# Patient Record
Sex: Male | Born: 1960
Health system: Southern US, Community
[De-identification: ages and names within clinical notes are randomized; demographics above are authoritative.]

## PROBLEM LIST (undated history)

## (undated) DIAGNOSIS — K644 Residual hemorrhoidal skin tags: Secondary | ICD-10-CM

## (undated) DIAGNOSIS — K5900 Constipation, unspecified: Secondary | ICD-10-CM

## (undated) HISTORY — DX: Residual hemorrhoidal skin tags: K64.4

## (undated) HISTORY — DX: Constipation, unspecified: K59.00

---

## 2004-02-16 ENCOUNTER — Ambulatory Visit: Payer: Self-pay | Admitting: Unknown Physician Specialty

## 2008-01-22 ENCOUNTER — Emergency Department: Payer: Self-pay | Admitting: Emergency Medicine

## 2010-04-28 HISTORY — PX: WRIST GANGLION EXCISION: SUR520

## 2014-04-28 HISTORY — PX: COLONOSCOPY: SHX174

## 2014-11-02 ENCOUNTER — Telehealth: Payer: Self-pay | Admitting: Family Medicine

## 2014-11-02 NOTE — Telephone Encounter (Signed)
Mailed results and gave info.

## 2014-11-02 NOTE — Telephone Encounter (Signed)
Pt said he was recently here for a physical and was given results of his test but he lost them.  He asked if they could be mailed to him.  He also said he was told about a clinic where he could be tested for hepatitis and hiv.  He asked if you could give him that telephone number so he could make an appt.  Please call

## 2014-12-27 ENCOUNTER — Ambulatory Visit (INDEPENDENT_AMBULATORY_CARE_PROVIDER_SITE_OTHER): Payer: BLUE CROSS/BLUE SHIELD | Admitting: Family Medicine

## 2014-12-27 ENCOUNTER — Encounter: Payer: Self-pay | Admitting: Family Medicine

## 2014-12-27 VITALS — BP 126/80 | HR 54 | Temp 98.0°F | Resp 16 | Ht 70.0 in | Wt 184.2 lb

## 2014-12-27 DIAGNOSIS — M25511 Pain in right shoulder: Secondary | ICD-10-CM | POA: Diagnosis not present

## 2014-12-27 MED ORDER — MELOXICAM 15 MG PO TABS
15.0000 mg | ORAL_TABLET | Freq: Every day | ORAL | Status: DC
Start: 2014-12-27 — End: 2015-12-24

## 2014-12-27 NOTE — Progress Notes (Signed)
Name: Nicholas Weber   MRN: [id number]    DOB: Jun 29, 1960   Date:12/27/2014       Progress Note  Subjective  Chief Complaint  Chief Complaint  Patient presents with  . Shoulder Pain    R shoulder pain with ROM only injury involved was at age 54 collar bone broke on same shoulder in car rack onset month ago    HPI  C/o  R shoulder pain x 1 month.  Pain with sleeing on that shoulder . Certain movements hurt shoulder.  He has stopped working quit with upper body exercises b/o pain. No problem-specific assessment & plan notes found for this encounter.   History reviewed. No pertinent past medical history.  Social History  Substance Use Topics  . Smoking status: Never Smoker   . Smokeless tobacco: Not on file  . Alcohol Use: Yes    No current outpatient prescriptions on file.  No Known Allergies  Review of Systems  Constitutional: Negative for fever and chills.  HENT: Negative for hearing loss.   Eyes: Negative for blurred vision and double vision.  Respiratory: Negative for cough, sputum production, shortness of breath and wheezing.   Cardiovascular: Negative for chest pain, palpitations and leg swelling.  Gastrointestinal: Negative for heartburn, abdominal pain, diarrhea and blood in stool.  Genitourinary: Negative for dysuria, urgency and frequency.  Musculoskeletal: Positive for joint pain (R. shoulder).  Skin: Negative for rash.  Neurological: Negative for dizziness and headaches.  Psychiatric/Behavioral: Negative for depression. The patient is not nervous/anxious.       Objective  Filed Vitals:   12/27/14 1501  BP: 126/80  Pulse: 54  Temp: 98 F (36.7 C)  TempSrc: Oral  Resp: 16  Height:  (1.778 m)  Weight: 184 lb 3.2 oz (83.553 kg)     Physical Exam  Constitutional: He is well-developed, well-nourished, and in no distress. No distress.  Musculoskeletal:  R. Shoulder sl. Tender along ant. Shoulder line.  Mild pain with ROM.  Most pain with  holding arm up against resistance.  No major weakness.  Vitals reviewed.     No results found for this or any previous visit (from the past 2160 hour(s)).   Assessment & Plan  1. Shoulder pain, acute, right  - meloxicam (MOBIC) 15 MG tablet; Take 1 tablet (15 mg total) by mouth daily.  Dispense: 30 tablet; Refill: 2 - Ambulatory referral to Physical Therapy

## 2015-03-03 ENCOUNTER — Emergency Department
Admission: EM | Admit: 2015-03-03 | Discharge: 2015-03-03 | Disposition: A | Discharge status: Home / Self Care | Payer: BLUE CROSS/BLUE SHIELD | Attending: Emergency Medicine | Admitting: Emergency Medicine

## 2015-03-03 ENCOUNTER — Encounter: Payer: Self-pay | Admitting: Emergency Medicine

## 2015-03-03 DIAGNOSIS — Z791 Long term (current) use of non-steroidal anti-inflammatories (NSAID): Secondary | ICD-10-CM | POA: Diagnosis not present

## 2015-03-03 DIAGNOSIS — J069 Acute upper respiratory infection, unspecified: Secondary | ICD-10-CM | POA: Diagnosis not present

## 2015-03-03 DIAGNOSIS — J029 Acute pharyngitis, unspecified: Secondary | ICD-10-CM | POA: Diagnosis present

## 2015-03-03 MED ORDER — CETIRIZINE HCL 10 MG PO CAPS: 10.0000 mg | ORAL_CAPSULE | Freq: Every day | ORAL | Status: DC

## 2015-03-03 MED ORDER — GUAIFENESIN-CODEINE 100-10 MG/5ML PO SOLN: 10.0000 mL | Freq: Three times a day (TID) | ORAL | Status: DC | PRN

## 2015-03-03 NOTE — ED Provider Notes (Signed)
[facility] Emergency Department Provider Note ____________________________________________  Time seen: Approximately 10:41 AM  I have reviewed the triage vital signs and the nursing notes.   HISTORY  Chief Complaint Sore Throat  HPI Nicholas Weber is a 54 y.o. male who presents to the emergency department for evaluation of sore throat, runny nose, and cough that started yesterday. He denies fever. He has not taken OTC meds for symptom relief.  History reviewed. No pertinent past medical history.  There are no active problems to display for this patient.   History reviewed. No pertinent past surgical history.  Current Outpatient Rx  Name  Route  Sig  Dispense  Refill  . Cetirizine HCl 10 MG CAPS   Oral   Take 1 capsule (10 mg total) by mouth daily.   30 capsule   3   . guaiFENesin-codeine 100-10 MG/5ML syrup   Oral   Take 10 mLs by mouth 3 (three) times daily as needed.   120 mL   0   . meloxicam (MOBIC) 15 MG tablet   Oral   Take 1 tablet (15 mg total) by mouth daily.   30 tablet   2     Allergies Review of patient's allergies indicates no known allergies.  Family History  Problem Relation Age of Onset  . Hypertension Mother   . Diabetes Father     Social History Social History  Substance Use Topics  . Smoking status: Never Smoker   . Smokeless tobacco: None  . Alcohol Use: Yes    Review of Systems Constitutional: No fever/chills Eyes: No visual changes. ENT: No sore throat. Cardiovascular: Denies chest pain. Respiratory: Negative for shortness of breath. Positive for cough. Gastrointestinal: Negative for abdominal pain. Negative for nausea,  Negative for vomiting.  Negative for diarrhea.  Genitourinary: Negative for dysuria. Musculoskeletal: Positive for body aches Skin: Negative for rash. Neurological: Negative for headaches, Negative for focal weakness or numbness. 10-point ROS otherwise  negative.  ____________________________________________   PHYSICAL EXAM:  VITAL SIGNS: ED Triage Vitals  Enc Vitals Group     BP 03/03/15 1032 115/72 mmHg     Pulse Rate 03/03/15 1032 80     Resp 03/03/15 1032 20     Temp 03/03/15 1032 98.5 F (36.9 C)     Temp Source 03/03/15 1032 Oral     SpO2 03/03/15 1032 96 %     Weight 03/03/15 1032 183 lb (83.008 kg)     Height 03/03/15 1032 5\' 10"  (1.778 m)     Head Cir --      Peak Flow --      Pain Score 03/03/15 1032 4     Pain Loc --      Pain Edu? --      Excl. in GC? --     Constitutional: Alert and oriented. Well appearing and in no acute distress. Eyes: Conjunctivae are normal. PERRL. EOMI. Head: Atraumatic. Nose: No congestion/rhinnorhea. Mouth/Throat: Mucous membranes are moist.  Oropharynx non-erythematous. Neck: No stridor.  Lymphatic: Anterior cervical lymphadenopathy is present. Cardiovascular: Normal rate, regular rhythm. Grossly normal heart sounds.  Good peripheral circulation. Respiratory: Normal respiratory effort.  No retractions. Lungs are clear to auscultation. Gastrointestinal: Soft and nontender. No distention. No abdominal bruits. No CVA tenderness. Musculoskeletal: No joint pain reported. Neurologic:  Normal speech and language. No gross focal neurologic deficits are appreciated. Speech is normal. No gait instability. Skin:  Skin is warm, dry and intact. No rash noted. Psychiatric: Mood and  affect are normal. Speech and behavior are normal.  ____________________________________________   LABS (all labs ordered are listed, but only abnormal results are displayed)  Labs Reviewed - No data to display ____________________________________________  EKG   ____________________________________________  RADIOLOGY  Not indicated. ____________________________________________   PROCEDURES  Procedure(s) performed: None  Critical Care performed:  No  ____________________________________________   INITIAL IMPRESSION / ASSESSMENT AND PLAN / ED COURSE  Pertinent labs & imaging results that were available during my care of the patient were reviewed by me and considered in my medical decision making (see chart for details).   Patient was advised to follow up with the primary care provider for symptoms that are not improving over the next 2-3 days. He was advised to return to the emergency department for symptoms that change or worsen if unable to schedule an appointment with the primary care provider or specialist. ____________________________________________   FINAL CLINICAL IMPRESSION(S) / ED DIAGNOSES  Final diagnoses:  Acute upper respiratory infection       Chinita Pester, FNP 03/03/15 1304  Jennye Moccasin, MD 03/03/15 1535

## 2015-03-03 NOTE — Discharge Instructions (Signed)
Upper Respiratory Infection, Adult Most upper respiratory infections (URIs) are a viral infection of the air passages leading to the lungs. A URI affects the nose, throat, and upper air passages. The most common type of URI is nasopharyngitis and is typically referred to as "the common cold." URIs run their course and usually go away on their own. Most of the time, a URI does not require medical attention, but sometimes a bacterial infection in the upper airways can follow a viral infection. This is called a secondary infection. Sinus and middle ear infections are common types of secondary upper respiratory infections. Bacterial pneumonia can also complicate a URI. A URI can worsen asthma and chronic obstructive pulmonary disease (COPD). Sometimes, these complications can require emergency medical care and may be life threatening.  CAUSES Almost all URIs are caused by viruses. A virus is a type of germ and can spread from one person to another.  RISKS FACTORS You may be at risk for a URI if:   You smoke.   You have chronic heart or lung disease.  You have a weakened defense (immune) system.   You are very young or very old.   You have nasal allergies or asthma.  You work in crowded or poorly ventilated areas.  You work in health care facilities or schools. SIGNS AND SYMPTOMS  Symptoms typically develop 2-3 days after you come in contact with a cold virus. Most viral URIs last 7-10 days. However, viral URIs from the influenza virus (flu virus) can last 14-18 days and are typically more severe. Symptoms may include:   Runny or stuffy (congested) nose.   Sneezing.   Cough.   Sore throat.   Headache.   Fatigue.   Fever.   Loss of appetite.   Pain in your forehead, behind your eyes, and over your cheekbones (sinus pain).  Muscle aches.  DIAGNOSIS  Your health care provider may diagnose a URI by:  Physical exam.  Tests to check that your symptoms are not due to  another condition such as:  Strep throat.  Sinusitis.  Pneumonia.  Asthma. TREATMENT  A URI goes away on its own with time. It cannot be cured with medicines, but medicines may be prescribed or recommended to relieve symptoms. Medicines may help:  Reduce your fever.  Reduce your cough.  Relieve nasal congestion. HOME CARE INSTRUCTIONS   Take medicines only as directed by your health care provider.   Gargle warm saltwater or take cough drops to comfort your throat as directed by your health care provider.  Use a warm mist humidifier or inhale steam from a shower to increase air moisture. This may make it easier to breathe.  Drink enough fluid to keep your urine clear or pale yellow.   Eat soups and other clear broths and maintain good nutrition.   Rest as needed.   Return to work when your temperature has returned to normal or as your health care provider advises. You may need to stay home longer to avoid infecting others. You can also use a face mask and careful hand washing to prevent spread of the virus.  Increase the usage of your inhaler if you have asthma.   Do not use any tobacco products, including cigarettes, chewing tobacco, or electronic cigarettes. If you need help quitting, ask your health care provider. PREVENTION  The best way to protect yourself from getting a cold is to practice good hygiene.   Avoid oral or hand contact with people with cold   symptoms.   Wash your hands often if contact occurs.  There is no clear evidence that vitamin C, vitamin E, echinacea, or exercise reduces the chance of developing a cold. However, it is always recommended to get plenty of rest, exercise, and practice good nutrition.  SEEK MEDICAL CARE IF:   You are getting worse rather than better.   Your symptoms are not controlled by medicine.   You have chills.  You have worsening shortness of breath.  You have brown or red mucus.  You have yellow or brown nasal  discharge.  You have pain in your face, especially when you bend forward.  You have a fever.  You have swollen neck glands.  You have pain while swallowing.  You have white areas in the back of your throat. SEEK IMMEDIATE MEDICAL CARE IF:   You have severe or persistent:  Headache.  Ear pain.  Sinus pain.  Chest pain.  You have chronic lung disease and any of the following:  Wheezing.  Prolonged cough.  Coughing up blood.  A change in your usual mucus.  You have a stiff neck.  You have changes in your:  Vision.  Hearing.  Thinking.  Mood. MAKE SURE YOU:   Understand these instructions.  Will watch your condition.  Will get help right away if you are not doing well or get worse.   This information is not intended to replace advice given to you by your health care provider. Make sure you discuss any questions you have with your health care provider.   Document Released: 10/08/2000 Document Revised: 08/29/2014 Document Reviewed: 07/20/2013 Elsevier Interactive Patient Education 2016 [employer]  

## 2015-03-03 NOTE — ED Notes (Signed)
Pt to ed with c/o sore throat, cough, and runny nose that started yesterday.  Denies fever.

## 2015-03-03 NOTE — ED Notes (Signed)
Discussed discharge instructions, prescriptions, and follow-up care with patient. No questions or concerns at this time. Pt stable at discharge.  

## 2015-12-24 ENCOUNTER — Ambulatory Visit (INDEPENDENT_AMBULATORY_CARE_PROVIDER_SITE_OTHER): Payer: BLUE CROSS/BLUE SHIELD | Admitting: Family Medicine

## 2015-12-24 ENCOUNTER — Encounter: Payer: Self-pay | Admitting: Family Medicine

## 2015-12-24 VITALS — BP 133/82 | HR 83 | Temp 98.1°F | Resp 16 | Ht 70.0 in | Wt 187.0 lb

## 2015-12-24 DIAGNOSIS — M25511 Pain in right shoulder: Secondary | ICD-10-CM

## 2015-12-24 DIAGNOSIS — S56911A Strain of unspecified muscles, fascia and tendons at forearm level, right arm, initial encounter: Secondary | ICD-10-CM | POA: Diagnosis not present

## 2015-12-24 MED ORDER — CYCLOBENZAPRINE HCL 10 MG PO TABS: 10.0000 mg | ORAL_TABLET | Freq: Three times a day (TID) | ORAL | 1 refills | Status: DC | PRN

## 2015-12-24 MED ORDER — MELOXICAM 15 MG PO TABS: 15.0000 mg | ORAL_TABLET | Freq: Every day | ORAL | 2 refills | Status: DC

## 2015-12-24 NOTE — Progress Notes (Signed)
Name: Nicholas MoleKenneth C Weber   MRN: [id number]    DOB: August 01, 1960   Date:12/24/2015       Progress Note  Subjective  Chief Complaint  Chief Complaint  Patient presents with  . Arm Pain    right forearm    HPI  C/o R inner forearm pain for past 6 months or so.  Getting worse with working out in gym and swimming.  No pain at rest. No problem-specific Assessment & Plan notes found for this encounter.   History reviewed. No pertinent past medical history.  Social History  Substance Use Topics  . Smoking status: Never Smoker  . Smokeless tobacco: Never Used  . Alcohol use Yes     Current Outpatient Prescriptions:  .  Cetirizine HCl 10 MG CAPS, Take 1 capsule (10 mg total) by mouth daily. (Patient taking differently: Take 10 mg by mouth daily as needed. ), Disp: 30 capsule, Rfl: 3 .  meloxicam (MOBIC) 15 MG tablet, Take 1 tablet (15 mg total) by mouth daily., Disp: 30 tablet, Rfl: 2 .  Multiple Vitamin (MULTIVITAMIN) tablet, Take 1 tablet by mouth daily., Disp: , Rfl:  .  cyclobenzaprine (FLEXERIL) 10 MG tablet, Take 1 tablet (10 mg total) by mouth 3 (three) times daily as needed for muscle spasms., Disp: 30 tablet, Rfl: 1  Not on File  Review of Systems  Constitutional: Negative.   HENT: Negative.   Eyes: Negative.   Respiratory: Negative.   Cardiovascular: Negative.   Gastrointestinal: Negative.   Genitourinary: Negative.   Musculoskeletal: Positive for myalgias (R forearm pain.).  Skin: Negative.   Neurological: Negative.       Objective  Vitals:   12/24/15 1601  BP: 133/82  Pulse: 83  Resp: 16  Temp: 98.1 F (36.7 C)  TempSrc: Oral  Weight: 187 lb (84.8 kg)  Height: 5\' 10"  (1.778 m)     Physical Exam  Constitutional: He is well-developed, well-nourished, and in no distress. No distress.  HENT:  Head: Normocephalic and atraumatic.  Cardiovascular: Normal rate, regular rhythm and normal heart sounds.   Pulmonary/Chest: Effort normal and breath sounds  normal.  Musculoskeletal: He exhibits no edema.  Pain in R forearm with bicep flexion against pressure.  No neurologic sx. Good peripheral pulses in R wrist.  Vitals reviewed.     No results found for this or any previous visit (from the past 2160 hour(s)).   Assessment & Plan  1. Muscle strain of forearm, right, initial encounter  - meloxicam (MOBIC) 15 MG tablet; Take 1 tablet (15 mg total) by mouth daily.  Dispense: 30 tablet; Refill: 2 - cyclobenzaprine (FLEXERIL) 10 MG tablet; Take 1 tablet (10 mg total) by mouth 3 (three) times daily as needed for muscle spasms.  Dispense: 30 tablet; Refill: 1 - Ambulatory referral to Physical Therapy  2. Shoulder pain, acute, right Resolved

## 2016-06-03 ENCOUNTER — Ambulatory Visit (INDEPENDENT_AMBULATORY_CARE_PROVIDER_SITE_OTHER): Payer: BLUE CROSS/BLUE SHIELD | Admitting: Family Medicine

## 2016-06-03 ENCOUNTER — Encounter: Payer: Self-pay | Admitting: Family Medicine

## 2016-06-03 VITALS — BP 121/71 | HR 76 | Temp 98.6°F | Resp 16 | Ht 70.0 in | Wt 186.0 lb

## 2016-06-03 DIAGNOSIS — K529 Noninfective gastroenteritis and colitis, unspecified: Secondary | ICD-10-CM

## 2016-06-03 MED ORDER — ONDANSETRON 4 MG PO TBDP: 4.0000 mg | ORAL_TABLET | Freq: Three times a day (TID) | ORAL | 0 refills | Status: DC | PRN

## 2016-06-03 NOTE — Progress Notes (Signed)
Name: Nicholas MoleKenneth C Weber   MRN: [id number]    DOB: 11/11/1960   Date:06/03/2016       Progress Note  Subjective  Chief Complaint  Chief Complaint  Patient presents with  . GI Problem    Nausea ansd vomiting    HPI Lost appetite yesterday AM.  [place].  He had some wine on Sunday.  Ate foods there.  Started N/V on Monday afternoon (4 PM).   He has thrown up x2 since.  No fever.  No diarrhea.  No  BM.  Can keep some fluids down.  No problem-specific Assessment & Plan notes found for this encounter.   History reviewed. No pertinent past medical history.  Social History  Substance Use Topics  . Smoking status: Never Smoker  . Smokeless tobacco: Never Used  . Alcohol use Yes     Current Outpatient Prescriptions:  .  Cetirizine HCl 10 MG CAPS, Take 1 capsule (10 mg total) by mouth daily. (Patient taking differently: Take 10 mg by mouth daily as needed. ), Disp: 30 capsule, Rfl: 3 .  cyclobenzaprine (FLEXERIL) 10 MG tablet, Take 1 tablet (10 mg total) by mouth 3 (three) times daily as needed for muscle spasms., Disp: 30 tablet, Rfl: 1 .  meloxicam (MOBIC) 15 MG tablet, Take 1 tablet (15 mg total) by mouth daily. (Patient taking differently: Take 15 mg by mouth daily as needed. ), Disp: 30 tablet, Rfl: 2 .  Multiple Vitamin (MULTIVITAMIN) tablet, Take 1 tablet by mouth daily., Disp: , Rfl:   Not on File  Review of Systems  Constitutional: Positive for malaise/fatigue. Negative for chills, fever and weight loss.  HENT: Negative.   Eyes: Negative.   Respiratory: Negative.   Cardiovascular: Negative.   Gastrointestinal: Positive for nausea and vomiting. Negative for abdominal pain, blood in stool, constipation, diarrhea, heartburn and melena.  Genitourinary: Negative.   Skin: Negative for rash.  Neurological: Positive for weakness and headaches.      Objective  Vitals:   06/03/16 0826  BP: 121/71  Pulse: 76  Resp: 16  Temp: 98.6 F (37 C)  TempSrc:  Oral  Weight: 186 lb (84.4 kg)  Height: 5\' 10"  (1.778 m)     Physical Exam  Constitutional: He is oriented to person, place, and time. He appears distressed (Appears to feel mildly ill.).  HENT:  Head: Normocephalic and atraumatic.  Neck: Normal range of motion. Neck supple.  Cardiovascular: Normal rate, regular rhythm and normal heart sounds.   Pulmonary/Chest: Effort normal and breath sounds normal.  Abdominal: Soft. He exhibits no distension and no mass. There is no tenderness.  Bowel sounds mildly hyperactive  Lymphadenopathy:    He has no cervical adenopathy.  Neurological: He is alert and oriented to person, place, and time.  Vitals reviewed.     No results found for this or any previous visit (from the past 2160 hour(s)).   Assessment & Plan  1. Gastroenteritis, acute  - ondansetron (ZOFRAN ODT) 4 MG disintegrating tablet; Take 1 tablet (4 mg total) by mouth every 8 (eight) hours as needed for nausea or vomiting.  Dispense: 20 tablet; Refill: 0 Clear liquids only today, then slowly increase diet. Go to ER if sx worsen

## 2016-08-18 DIAGNOSIS — M2022 Hallux rigidus, left foot: Secondary | ICD-10-CM | POA: Diagnosis not present

## 2016-08-18 DIAGNOSIS — G9050 Complex regional pain syndrome I, unspecified: Secondary | ICD-10-CM | POA: Diagnosis not present

## 2016-08-26 ENCOUNTER — Ambulatory Visit (INDEPENDENT_AMBULATORY_CARE_PROVIDER_SITE_OTHER): Payer: BLUE CROSS/BLUE SHIELD | Admitting: Family Medicine

## 2016-08-26 ENCOUNTER — Encounter: Payer: Self-pay | Admitting: Family Medicine

## 2016-08-26 VITALS — BP 119/72 | HR 84 | Temp 98.7°F | Resp 16 | Ht 70.0 in | Wt 187.0 lb

## 2016-08-26 DIAGNOSIS — X500XXA Overexertion from strenuous movement or load, initial encounter: Secondary | ICD-10-CM | POA: Diagnosis not present

## 2016-08-26 DIAGNOSIS — S46819A Strain of other muscles, fascia and tendons at shoulder and upper arm level, unspecified arm, initial encounter: Secondary | ICD-10-CM

## 2016-08-26 DIAGNOSIS — M549 Dorsalgia, unspecified: Secondary | ICD-10-CM | POA: Diagnosis not present

## 2016-08-26 MED ORDER — BACLOFEN 10 MG PO TABS: 5.0000 mg | ORAL_TABLET | Freq: Two times a day (BID) | ORAL | 1 refills | Status: DC | PRN

## 2016-08-26 NOTE — Patient Instructions (Signed)
Thank you for coming to the clinic today.  1. Most likely you have a Trapezius or upper back muscle strain from lifting, caution in future with doing squats with bar across back, may also have a mild contusion to bone or spine / muscle from the weight. - Agree to hold off on X-rays today, if not improving will consider this  Recommend trial of Anti-inflammatory with OTC Aleve (naproxen) 220-250mg  tabs - take ONE to TWO tablets with food and plenty of water TWICE daily every day (breakfast and dinner), for next 1-2 weeks, then you may take only as needed - DO NOT TAKE any ibuprofen, meloxicam, motrin while you are taking this medicine - It is safe to take Tylenol Ext Str  tabs - take 1 to 2 (max dose ) every 6 hours as needed for breakthrough pain, max 24 hour daily dose is 6 tablets or   Start taking Baclofen (Lioresal)  (muscle relaxant) - start with half (cut) to one whole pill at night as needed for next 1-3 nights (may make you drowsy, caution with driving) see how it affects you, then if tolerated increase to one pill 2 to 3 times a day or (every 8 hours as needed)  Use moist heat or heating pad on shoulders / neck, and have family member help with soft tissue massage as demonstrated  If needed in future we can consider referral to Physical Therapy  Please schedule a follow-up appointment with Dr. Althea Charon in 4-6 follow-up if not improved upper back / muscle pain  If you have any other questions or concerns, please feel free to call the clinic or send a message through MyChart. You may also schedule an earlier appointment if necessary.  Saralyn Pilar, DO [facility], [place]

## 2016-08-26 NOTE — Progress Notes (Signed)
Subjective:    Patient ID: Nicholas Weber, male    DOB: 1960-10-13, 56 y.o.   MRN: [id number]  Nicholas Weber is a 56 y.o. male presenting on 08/26/2016 for Back Pain (Patient here today C/O upper mid back pain, reports he may have injured while exercising about aone month ago. )   HPI   Subacute Upper Back Pain: - Reports symptoms started about 3-4 weeks ago with possible inciting injury while at gym doing some squats with 45 lb plate on each side, he did not have any immediate pain or notice any problem, but he did have the bar resting on upper middle neck/back. Then he described some gradual worsening over 2-3 days with some onset of soreness and deeper pain, worse over 1 week, then gradual improvement now. Currently describes pain now at rest with mostly resolved at 0/10, only feels the pain with certain movements, up to 5/10 at worst which is improved, describes as soreness with sharp pain. No radiating pain down arms. - Not taking any OTC pain medicine, Tylenol, Ibuprofen, Aleve - Has not tried heating pad, ice, muscle rub - No known prior history of spinal OA/DJD, no prior surgery - Working physical job with reaching pulling, pain has not interfered with his working for most part - Denies any fevers/chills, numbness, tingling, weakness  Social History  Substance Use Topics  . Smoking status: Never Smoker  . Smokeless tobacco: Never Used  . Alcohol use Yes    Review of Systems Per HPI unless specifically indicated above     Objective:    BP 119/72 (BP Location: Left Arm, Patient Position: Sitting, Cuff Size: Large)   Pulse 84   Temp 98.7 F (37.1 C) (Oral)   Resp 16   Ht  (1.778 m)   Wt 187 lb (84.8 kg)   SpO2 100%   BMI 26.83 kg/m   Wt Readings from Last 3 Encounters:  08/26/16 187 lb (84.8 kg)  06/03/16 186 lb (84.4 kg)  12/24/15 187 lb (84.8 kg)    Physical Exam  Constitutional: He appears well-developed and well-nourished. No distress.    Well-appearing, comfortable, cooperative  HENT:  Head: Normocephalic and atraumatic.  Mouth/Throat: Oropharynx is clear and moist.  Cardiovascular: Normal rate.   Pulmonary/Chest: Effort normal.  Musculoskeletal:  Upper Back Inspection: Normal appearance, normal body habitus, no spinal deformity, symmetrical. Palpation: Mild tenderness over upper T-spine spinous processes, mild discomfort lower Cervical and upper Thoracic paraspinal muscles with hypertonicity and spasm of bilateral trapezius muscles. Paraspinal muscles less spasm. ROM: Full active ROM forward flex / back extension, rotation L/R without discomfort Special Testing: Spurling's Maneuever negative radiculopathy. Strength: Upper extremities biceps, deltoid, grip, wrist 5/5 Neurovascular: intact distal sensation to light touch  Bilateral Shoulders Inspection: Normal appearance bilateral symmetrical Palpation: Non-tender to palpation over anterior, lateral, or posterior shoulder  ROM: Full intact active ROM forward flexion, abduction, internal / external rotation, symmetrical Special Testing: Rotator cuff testing negative for weakness with supraspinatus full can and empty can test. Hawkin's AC impingement negative for pain  Neurological: He is alert.  Skin: Skin is warm and dry. No rash noted. He is not diaphoretic. No erythema.  Psychiatric: He has a normal mood and affect. His behavior is normal.  Nursing note and vitals reviewed.  No results found for this or any previous visit.    Assessment & Plan:   Problem List Items Addressed This Visit    None    Visit Diagnoses  Upper back pain    -  Primary  Subacute new injury bilateral upper back (thoracic) pain with assoc bilateral trapezius muscle spasm - Likely secondary to gym squat weight lifting injury, no acute problem but gradual worsening following inc gym exercise, likely muscle strain. No neurological deficits or weakness. - No prior known DJD in spine or  other joints - Inadequate conservative therapy  Plan: 1. Reassurance likely self limited, may take few more weeks to get to 100%, caution with resuming weight exercises at gym, avoid squats in future, alternative technique 2. Start anti-inflammatory trial with rx OTC Aleve  tabs, take 1-2 per dose BID wc x 1-2 weeks, then PRN 3. Take Tylenol 1g TID PRN breakthrough 4. New rx muscle relaxant Baclofen  tabs - take 5-10mg  up to TID PRN, titrate up as tolerated, ideally for at night only if still has residual spasm and pain at night 5. Encouraged use of heating pad 1-2x daily for now then PRN - Defer x-rays today 6. Follow-up 4-6 weeks if not resolved, re-valuation, consider x-ray, PT referral     Relevant Medications   baclofen (LIORESAL) 10 MG tablet   Strain of trapezius muscle, unspecified laterality, initial encounter       Relevant Medications   baclofen (LIORESAL) 10 MG tablet   Injury caused by lifting, initial encounter          Meds ordered this encounter  Medications  . baclofen (LIORESAL) 10 MG tablet    Sig: Take 0.5-1 tablets (5-10 mg total) by mouth 2 (two) times daily as needed for muscle spasms. May take only at night if prefer.    Dispense:  15 each    Refill:  1      Follow up plan: Return in about 4 weeks (around 09/23/2016), or if symptoms worsen or fail to improve, for  upper back / muscle pain.  Saralyn Pilar, DO [facility] 08/26/2016, 7:32 PM

## 2016-11-25 ENCOUNTER — Encounter: Payer: Self-pay | Admitting: Family Medicine

## 2016-11-25 ENCOUNTER — Ambulatory Visit (INDEPENDENT_AMBULATORY_CARE_PROVIDER_SITE_OTHER): Payer: BLUE CROSS/BLUE SHIELD | Admitting: Family Medicine

## 2016-11-25 DIAGNOSIS — K644 Residual hemorrhoidal skin tags: Secondary | ICD-10-CM

## 2016-11-25 DIAGNOSIS — K5901 Slow transit constipation: Secondary | ICD-10-CM | POA: Diagnosis not present

## 2016-11-25 DIAGNOSIS — K5900 Constipation, unspecified: Secondary | ICD-10-CM | POA: Insufficient documentation

## 2016-11-25 HISTORY — DX: Constipation, unspecified: K59.00

## 2016-11-25 HISTORY — DX: Residual hemorrhoidal skin tags: K64.4

## 2016-11-25 MED ORDER — POLYETHYLENE GLYCOL 3350 17 GM/SCOOP PO POWD: 17.0000 g | Freq: Every day | ORAL | 2 refills | Status: DC | PRN

## 2016-11-25 MED ORDER — HYDROCORTISONE ACETATE 25 MG RE SUPP: 25.0000 mg | Freq: Two times a day (BID) | RECTAL | 3 refills | Status: DC | PRN

## 2016-11-25 NOTE — Assessment & Plan Note (Signed)
Subacute on chronic external hemorrhoids with recurrent active bleeding Today uncomplicated with inflamation/swelling/active bleed Unable to appreciate deeper internal hemorrhoids. No evidence of anal fissure or any perineal problems, no sign of erythema or cellulitis. - Inadequate conservative therapy - previously improved on suppository, diet changes in past  Plan: 1. Discussion on diagnosis and management pathophys of hemorrhoids - request review of his last colonoscopy 2015 to see if documented internal hemorrhoids 2. Start Miralax 17-34g 1-2 cap 1-2 times daily self titrate to goal 1-2 soft stools daily, may need as maintenance or PRN only 3. For flare - start rx Anusol-HC hydrocortisone 25mg  suppository BID for 6-7 days, given refill if need repeat course 4. Start Sitz Baths or warm bathtub soaks to help resolve flare 5. Avoid constipation and straining, recommend high fiber diet, improve hydration 6. Caution with prolonged sitting 7. Reviewed return criteria if not improving, also advised that if significant worsening given location and size of hemorrhoidal tissue, may need General Surgery evaluation and management

## 2016-11-25 NOTE — Patient Instructions (Addendum)
Thank you for coming to the clinic today.  1. You have an inflamed external hemorrhoid, which involves swollen veins on your rectum, it is very sensitive and causes your severe pain. You may experience worsening pain and bleeding with bright red blood if the hemorrhoid develops a superficial blood clot. Also you may have deeper internal hemorrhoids that can cause bleeding without as much pain. - If not improved - - Start Miralax 17g = 1 capful daily with full glass water, after 2-3 days if not at goal, can increase or continue longer - Goal = 1-2 soft well formed stools (not liquid, not pellets or firm)   - start using the Anusol suppository twice a day as prescribed for 1 week, given a refill if needed for flare up - continue the warm bathtub soak 1-2 times daily for next week if you can, or can try the [facility] for just your bottom - Try to stay well hydrated, avoid constipation and straining, eat a high fiber diet  If you get significant worsening pain, rectal bleeding, or not responding to treatment, please notify our office and we will anticipate on an urgent referral to General Surgery office as you may need an office procedure to resolve hemorrhoidal tissue, this is most successful if it is early in the course within 24-72 hours of acute worsening pain and bleeding.  Otherwise if just chronic problem - we can consider referral to General Surgery for more long-term options to help cure hemorrhoids  Look for record from your Colonoscopy to see if it says anything else about hemorrhoids - and if you can have them fax it or drop it off at our office.  Please schedule a Follow-up Appointment to: Return in about 4 weeks (around 12/23/2016), or if symptoms worsen or fail to improve, for hemorrhoids.  If you have any other questions or concerns, please feel free to call the clinic or send a message through MyChart. You may also schedule an earlier appointment if necessary.  Additionally, you may  be receiving a survey about your experience at our clinic within a few days to 1 week by e-mail or mail. We value your feedback.  Saralyn PilarAlexander Peytan Andringa, DO [facility]

## 2016-11-25 NOTE — Assessment & Plan Note (Signed)
Likely functional, worsening hemorrhoids can be cyclical Improve diet, inc fiber, hydration, may consider resume fiber supplement Start Miralax 17-34g 1-2 cap 1-2 times daily self titrate to goal 1-2 soft stools daily, may need as maintenance or PRN only

## 2016-11-25 NOTE — Progress Notes (Signed)
Subjective:    Patient ID: Nicholas Weber, male    DOB: 04/11/1961, 56 y.o.   MRN: [id number]  Nicholas MoleKenneth C Hagos is a 56 y.o. male presenting on 11/25/2016 for Hemorrhoids (onset month)   HPI   HEMORRHOIDS / Rectal Bleeding - Reports known chronic history of prior hemorrhoids suspected to be external hemorrhoids with his feeling "swelling", has had prior flares usually about 1x monthly or sometimes less frequent, he was treated in past with Hydrocortisone suppository with good results, in past has had recurrent episodes of bright - Today reports recent episode with some concern for BRBPR with hemorrhoidal bleeding for past 1 month, without worsening but not improving. Describes now almost every BM with bright red blood, without significant pain. - Tried Preparation H recently ointment without relief - Does not have suppository anymore - Previous bowel habits usually regular about 1x daily, had problem with constipation recently about 2 weeks ago when he traveled to [place] had x 1 BM in 1 week - Admits symptoms hemorrhoids worse with swelling with BM then calms down, and improves - History of prior colon[place] in approx [place], reported normal and good for 10 years, but did not comment on his hemorrhoids, unsure if any internal hemorrhoids - Denies any active abdominal pain, rectal pain, dark stools, n/v, unintentional weight loss   Social History  Substance Use Topics  . Smoking status: Never Smoker  . Smokeless tobacco: Never Used  . Alcohol use Yes    Review of Systems Per HPI unless specifically indicated above     Objective:    BP 121/66   Pulse 63   Temp 98.1 F (36.7 C) (Oral)   Resp 16   Ht 5\' 10"  (1.778 m)   Wt 188 lb (85.3 kg)   BMI 26.98 kg/m   Wt Readings from Last 3 Encounters:  11/25/16 188 lb (85.3 kg)  08/26/16 187 lb (84.8 kg)  06/03/16 186 lb (84.4 kg)    Physical Exam  Constitutional: He is oriented to person, place, and time. He  appears well-developed and well-nourished. No distress.  Well-appearing, comfortable, cooperative  HENT:  Head: Normocephalic and atraumatic.  Mouth/Throat: Oropharynx is clear and moist.  Eyes: Conjunctivae are normal. Right eye exhibits no discharge. Left eye exhibits no discharge.  Cardiovascular: Normal rate and intact distal pulses.   Pulmonary/Chest: Effort normal.  Genitourinary:  Genitourinary Comments: Rectal: External exam with non inflamed non swollen external hemorrhoids, no evidence of anal fissures or active bleeding. Deferred DRE by patient request.  Musculoskeletal: He exhibits no edema.  Neurological: He is alert and oriented to person, place, and time.  Skin: Skin is warm and dry. No rash noted. He is not diaphoretic. No erythema.  Psychiatric: He has a normal mood and affect. His behavior is normal.  Well groomed, good eye contact, normal speech and thoughts  Nursing note and vitals reviewed.  No results found for this or any previous visit.    Assessment & Plan:   Problem List Items Addressed This Visit    Constipation    Likely functional, worsening hemorrhoids can be cyclical Improve diet, inc fiber, hydration, may consider resume fiber supplement Start Miralax 17-34g 1-2 cap 1-2 times daily self titrate to goal 1-2 soft stools daily, may need as maintenance or PRN only      Relevant Medications   polyethylene glycol powder (GLYCOLAX/MIRALAX) powder   Bleeding external hemorrhoids    Subacute on chronic external hemorrhoids with recurrent active  bleeding Today uncomplicated with inflamation/swelling/active bleed Unable to appreciate deeper internal hemorrhoids. No evidence of anal fissure or any perineal problems, no sign of erythema or cellulitis. - Inadequate conservative therapy - previously improved on suppository, diet changes in past  Plan: 1. Discussion on diagnosis and management pathophys of hemorrhoids - request review of his last colonoscopy 2015  to see if documented internal hemorrhoids 2. Start Miralax 17-34g 1-2 cap 1-2 times daily self titrate to goal 1-2 soft stools daily, may need as maintenance or PRN only 3. For flare - start rx Anusol-HC hydrocortisone 25mg  suppository BID for 6-7 days, given refill if need repeat course 4. Start Sitz Baths or warm bathtub soaks to help resolve flare 5. Avoid constipation and straining, recommend high fiber diet, improve hydration 6. Caution with prolonged sitting 7. Reviewed return criteria if not improving, also advised that if significant worsening given location and size of hemorrhoidal tissue, may need General Surgery evaluation and management      Relevant Medications   polyethylene glycol powder (GLYCOLAX/MIRALAX) powder   hydrocortisone (ANUSOL-HC) 25 MG suppository      Meds ordered this encounter  Medications  . polyethylene glycol powder (GLYCOLAX/MIRALAX) powder    Sig: Take 17-34 g by mouth daily as needed. May split dose into twice daily if needed. May use as maintenance    Dispense:  289 g    Refill:  2  . hydrocortisone (ANUSOL-HC) 25 MG suppository    Sig: Place 1 suppository (25 mg total) rectally 2 (two) times daily as needed for hemorrhoids or anal itching. For 6 days for flare    Dispense:  12 suppository    Refill:  3    Follow up plan: Return in about 4 weeks (around 12/23/2016), or if symptoms worsen or fail to improve, for hemorrhoids.  Greater than 25 minutes was spent in face-to-face time with this patient, more than half of which was devoted to counseling on management options and self care / dietary options for constipation and hemorrhoids.  Saralyn PilarAlexander Yamileth Hayse, DO [facility] 11/25/2016, 5:57 PM

## 2017-09-08 ENCOUNTER — Encounter: Payer: Self-pay | Admitting: Family Medicine

## 2017-09-08 ENCOUNTER — Ambulatory Visit (INDEPENDENT_AMBULATORY_CARE_PROVIDER_SITE_OTHER): Payer: BLUE CROSS/BLUE SHIELD | Admitting: Family Medicine

## 2017-09-08 ENCOUNTER — Other Ambulatory Visit: Payer: Self-pay | Admitting: Family Medicine

## 2017-09-08 VITALS — BP 125/78 | HR 49 | Temp 98.2°F | Resp 16 | Ht 70.0 in | Wt 186.0 lb

## 2017-09-08 DIAGNOSIS — Z0000 Encounter for general adult medical examination without abnormal findings: Secondary | ICD-10-CM

## 2017-09-08 DIAGNOSIS — Z131 Encounter for screening for diabetes mellitus: Secondary | ICD-10-CM

## 2017-09-08 DIAGNOSIS — Z114 Encounter for screening for human immunodeficiency virus [HIV]: Secondary | ICD-10-CM

## 2017-09-08 DIAGNOSIS — Z1159 Encounter for screening for other viral diseases: Secondary | ICD-10-CM

## 2017-09-08 DIAGNOSIS — K5901 Slow transit constipation: Secondary | ICD-10-CM | POA: Diagnosis not present

## 2017-09-08 DIAGNOSIS — K644 Residual hemorrhoidal skin tags: Secondary | ICD-10-CM

## 2017-09-08 DIAGNOSIS — Z125 Encounter for screening for malignant neoplasm of prostate: Secondary | ICD-10-CM

## 2017-09-08 DIAGNOSIS — Z13220 Encounter for screening for lipoid disorders: Secondary | ICD-10-CM

## 2017-09-08 NOTE — Assessment & Plan Note (Signed)
Resolved now on miralax, metamucil, high fiber diet Seems to not be contributing as much to hemorrhoid bleeding flares - Continue therapy as needed for now, maintain high fiber diet

## 2017-09-08 NOTE — Patient Instructions (Addendum)
Thank you for coming to the office today.  Since still having frequent bleeding, over course of >1 year, I am concerned need to actually treat the hemorrhoid instead of trying to prevent it.  Referral to Satira Mccallum - General Surgery for 2nd opinion on hemorrhoids  [facility] [address] [place], [place]  [address] Hours: 8:30am to 5pm (M-F) Ph [phone]  DUE for FASTING BLOOD WORK (no food or drink after midnight before the lab appointment, only water or coffee without cream/sugar on the morning of)  SCHEDULE "Lab Only" visit in the morning at the clinic for lab draw in 3 MONTHS   - Make sure Lab Only appointment is at about 1 week before your next appointment, so that results will be available  For Lab Results, once available within 2-3 days of blood draw, you can can log in to MyChart online to view your results and a brief explanation. Also, we can discuss results at next follow-up visit.   Please schedule a Follow-up Appointment to: Return in about 3 months (around 12/09/2017) for Annual Physical.  If you have any other questions or concerns, please feel free to call the office or send a message through MyChart. You may also schedule an earlier appointment if necessary.  Additionally, you may be receiving a survey about your experience at our office within a few days to 1 week by e-mail or mail. We value your feedback.  Saralyn Pilar, DO [facility], [place]

## 2017-09-08 NOTE — Assessment & Plan Note (Addendum)
Persistent chronic problem with BRBPR with hemorrhoid flares, suspected external hemorrhoid from prior exam, however history without rectal pain and painless bleeding suggestive more of internal hemorrhoids, likely mixture - Improved without as many provoked flares, now on bowel regimen and w/ suppository PRN - Still no colonoscopy record available, requested record, from prior [facility] provider 2015  Plan - Discussion again on hemorrhoids and his bleeding, given persistent episodic flares and difficulty with significant bleeding episodes causing patient distress - Recommended 2nd opinion by General Surgery for more complete rectal exam to determine appropriate diagnosis - may determine if internal and external or other cause - and review more definitive surgical or procedural options for treatment instead of trying to reduce frequency, seems max conservative care is not effective  Referral to [facility] Dr Satira Mccallum  Follow-up as needed

## 2017-09-08 NOTE — Progress Notes (Signed)
Subjective:    Patient ID: Nicholas Weber, male    DOB: July 15, 1960, 57 y.o.   MRN: [id number]  Nicholas Weber is a 57 y.o. male presenting on 09/08/2017 for Hemorrhoids (occ bleeding )   HPI   Follow-up Hemorrhoids / Rectal Bleeding / Constipation - Last visit with me for this problem 10/2016, treated with conservative options to help prevent constipation and reduce severity of hemorrhoid flares, he was given rx Miralax powder regularly and Anusol suppository PRN, see prior notes for background information. - Interval update with notable improvement with treatment, he has also increased metamucil and dietary fiber intake and overall doing well and no longer has constipation regularly, has regular soft BMs - Today patient reports that he still has episodes of hemorrhoid bleeding with bright red blood per rectum with flare ups, somewhat unpredictable but has episodes fairly often, can get swelling of hemorrhoid and "dripping" blood into toilet sometimes stain pants on occasion after a BM. He applies pressure to reduce bleeding. - Medicines help but do not stop bleeding including suppository - We never received Colonoscopy report from previous provider from [facility] in 2015 - Denies significant pain with hemorrhoids - Denies any active abdominal pain, rectal pain, dark stools, n/v, unintentional weight loss   Health Maintenance: Overdue for annual physical Due for routine HIV and Hep C screening tests  Depression screen [facility] 2/9 09/08/2017 06/03/2016 12/24/2015  Decreased Interest 0 0 0  Down, Depressed, Hopeless 0 0 0  PHQ - 2 Score 0 0 0    Social History   Tobacco Use  . Smoking status: Never Smoker  . Smokeless tobacco: Never Used  Substance Use Topics  . Alcohol use: Yes  . Drug use: No    Review of Systems Per HPI unless specifically indicated above     Objective:    BP 125/78   Pulse (!) 49   Temp 98.2 F (36.8 C) (Oral)   Resp 16   Ht  (1.778 m)   Wt 186  lb (84.4 kg)   BMI 26.69 kg/m   Wt Readings from Last 3 Encounters:  09/08/17 186 lb (84.4 kg)  11/25/16 188 lb (85.3 kg)  08/26/16 187 lb (84.8 kg)    Physical Exam  Constitutional: He is oriented to person, place, and time. He appears well-developed and well-nourished. No distress.  Well-appearing, comfortable, cooperative  HENT:  Head: Normocephalic and atraumatic.  Mouth/Throat: Oropharynx is clear and moist.  Eyes: Conjunctivae are normal. Right eye exhibits no discharge. Left eye exhibits no discharge.  Cardiovascular: Normal rate and intact distal pulses.  Pulmonary/Chest: Effort normal.  Genitourinary:  Genitourinary Comments: Did not repeat rectal exam today by patient request since he states not currently flared.  LAST VISIT 10/2016 - Rectal: External exam with non inflamed non swollen external hemorrhoids, no evidence of anal fissures or active bleeding.  Musculoskeletal: He exhibits no edema.  Neurological: He is alert and oriented to person, place, and time.  Skin: Skin is warm and dry. No rash noted. He is not diaphoretic. No erythema.  Psychiatric: He has a normal mood and affect. His behavior is normal.  Well groomed, good eye contact, normal speech and thoughts  Nursing note and vitals reviewed.  No results found for this or any previous visit.    Assessment & Plan:   Problem List Items Addressed This Visit    Bleeding external hemorrhoids - Primary    Persistent chronic problem with BRBPR with hemorrhoid flares, suspected  external hemorrhoid from prior exam, however history without rectal pain and painless bleeding suggestive more of internal hemorrhoids, likely mixture - Improved without as many provoked flares, now on bowel regimen and w/ suppository PRN - Still no colonoscopy record available, requested record, from prior [facility] provider 2015  Plan - Discussion again on hemorrhoids and his bleeding, given persistent episodic flares and difficulty with  significant bleeding episodes causing patient distress - Recommended 2nd opinion by General Surgery for more complete rectal exam to determine appropriate diagnosis - may determine if internal and external or other cause - and review more definitive surgical or procedural options for treatment instead of trying to reduce frequency, seems max conservative care is not effective  Referral to [facility] [place] [facility] Satira Mccallum  Follow-up as needed      Relevant Orders   Ambulatory referral to General Surgery   Constipation    Resolved now on miralax, metamucil, high fiber diet Seems to not be contributing as much to hemorrhoid bleeding flares - Continue therapy as needed for now, maintain high fiber diet         No orders of the defined types were placed in this encounter.  Orders Placed This Encounter  Procedures  . Ambulatory referral to General Surgery    Referral Priority:   Routine    Referral Type:   Surgical    Referral Reason:   Specialty Services Required    Referred to Provider:   Ancil Linsey, MD    Requested Specialty:   General Surgery    Number of Visits Requested:   1    Follow up plan: Return in about 3 months (around 12/09/2017) for Annual Physical.  Future labs ordered for 3 months (11/2017)  Saralyn Pilar, DO [facility] 09/08/2017, 4:30 PM

## 2017-10-07 ENCOUNTER — Ambulatory Visit: Payer: Self-pay | Admitting: Surgery

## 2017-10-13 ENCOUNTER — Telehealth: Payer: Self-pay | Admitting: General Practice

## 2017-10-13 NOTE — Telephone Encounter (Signed)
Patient is coming in on July 1st.

## 2017-10-13 NOTE — Telephone Encounter (Signed)
Left a message for the patient to call the office, patient no showed new patient appointment on 10/07/17 with Dr. Earlene Plateravis. Please r/s if the patient calls back.

## 2017-10-26 ENCOUNTER — Encounter: Payer: Self-pay | Admitting: Surgery

## 2017-10-26 ENCOUNTER — Ambulatory Visit (INDEPENDENT_AMBULATORY_CARE_PROVIDER_SITE_OTHER): Payer: BLUE CROSS/BLUE SHIELD | Admitting: Surgery

## 2017-10-26 VITALS — BP 133/85 | HR 57 | Ht 72.0 in | Wt 183.0 lb

## 2017-10-26 DIAGNOSIS — K649 Unspecified hemorrhoids: Secondary | ICD-10-CM

## 2017-10-26 NOTE — Progress Notes (Signed)
Patient ID: Nicholas Weber, male   DOB: October 08, 1960, 57 y.o.   MRN: [id number]  HPI Nicholas Weber is a 57 y.o. male seen in consultation at the request of Dr. Althea Charon for hemorrhoids.  He reports that over the last few months has been having some intermittent hematochezia.  Over the last 3 weeks he has changed his diet with significant improvement of symptoms.  He has tried some Anusol and some Metamucil with some relief.  No previous evidence of hemorrhoid surgery.  He did have a colonoscopy 4 years ago with no major GI pathology. He is able to perform more than 4 METS without any shortness of breath or chest pain  HPI  Past Medical History:  Diagnosis Date  . Bleeding external hemorrhoids 11/25/2016  . Constipation 11/25/2016    Past Surgical History:  Procedure Laterality Date  . COLONOSCOPY  2016    Family History  Problem Relation Age of Onset  . Hypertension Mother   . Diabetes Father     Social History Social History   Tobacco Use  . Smoking status: Never Smoker  . Smokeless tobacco: Never Used  Substance Use Topics  . Alcohol use: Yes  . Drug use: No    No Known Allergies  Current Outpatient Medications  Medication Sig Dispense Refill  . baclofen (LIORESAL) 10 MG tablet Take 0.5-1 tablets (5-10 mg total) by mouth 2 (two) times daily as needed for muscle spasms. May take only at night if prefer. 15 each 1  . hydrocortisone (ANUSOL-HC) 25 MG suppository Place 1 suppository (25 mg total) rectally 2 (two) times daily as needed for hemorrhoids or anal itching. For 6 days for flare 12 suppository 3  . Multiple Vitamin (MULTIVITAMIN) tablet Take 1 tablet by mouth daily.    . polyethylene glycol powder (GLYCOLAX/MIRALAX) powder Take 17-34 g by mouth daily as needed. May split dose into twice daily if needed. May use as maintenance (Patient not taking: Reported on 10/26/2017) 289 g 2   No current facility-administered medications for this visit.      Review of  Systems Full ROS  was asked and was negative except for the information on the HPI  Physical Exam Blood pressure 133/85, pulse (!) 57, height 6' (1.829 m), weight 83 kg (183 lb). CONSTITUTIONAL: NAD EARS, NOSE, MOUTH AND THROAT: The oropharynx is clear. The oral mucosa is pink and moist. Hearing is intact to voice. LYMPH NODES:  Lymph nodes in the neck are normal. GI: The abdomen is  soft, nontender, and nondistended. There are no palpable masses. There is no hepatosplenomegaly. There are normal bowel sounds in all quadrants. Rectal : lateral skin tags bilaterally. Left lateral Internal hemorrhoid grade II, no masses   MUSCULOSKELETAL: Normal muscle strength and tone. No cyanosis or edema.   SKIN: Turgor is good and there are no pathologic skin lesions or ulcers. NEUROLOGIC: Motor and sensation is grossly normal. Cranial nerves are grossly intact. PSYCH:  Oriented to person, place and time. Affect is normal.  Data Reviewed  I have personally reviewed the patient's imaging, laboratory findings and medical records.    Assessment/Plan Symptomatically internal hemorrhoids.  Discussed with the patient detail about diet modification, high-fiber diet, sitz bath's and a stool softener.  He seems to be improving with medical therapy.  If obviously does not improve we will see him back in about a month or so and consider surgical intervention.  He is in agreement with the plan Copy of this report will be  sent to the referring provider  Sterling Bigiego Laura Caldas, MD FACS General Surgeon 10/26/2017, 4:01 PM

## 2017-10-26 NOTE — Patient Instructions (Signed)
Patient to return as needed. The patient is aware to call back for any questions or concerns. 

## 2018-02-10 ENCOUNTER — Other Ambulatory Visit: Payer: BLUE CROSS/BLUE SHIELD

## 2018-02-10 DIAGNOSIS — K644 Residual hemorrhoidal skin tags: Secondary | ICD-10-CM | POA: Diagnosis not present

## 2018-02-10 DIAGNOSIS — Z131 Encounter for screening for diabetes mellitus: Secondary | ICD-10-CM

## 2018-02-10 DIAGNOSIS — Z1159 Encounter for screening for other viral diseases: Secondary | ICD-10-CM | POA: Diagnosis not present

## 2018-02-10 DIAGNOSIS — Z114 Encounter for screening for human immunodeficiency virus [HIV]: Secondary | ICD-10-CM | POA: Diagnosis not present

## 2018-02-10 DIAGNOSIS — Z125 Encounter for screening for malignant neoplasm of prostate: Secondary | ICD-10-CM | POA: Diagnosis not present

## 2018-02-10 DIAGNOSIS — Z13220 Encounter for screening for lipoid disorders: Secondary | ICD-10-CM | POA: Diagnosis not present

## 2018-02-10 DIAGNOSIS — Z0000 Encounter for general adult medical examination without abnormal findings: Secondary | ICD-10-CM

## 2018-02-11 LAB — CBC WITH DIFFERENTIAL/PLATELET
BASOS PCT: 2 %
Basophils Absolute: 80 cells/uL (ref 0–200)
EOS ABS: 272 {cells}/uL (ref 15–500)
EOS PCT: 6.8 %
HCT: 36.5 % — ABNORMAL LOW (ref 38.5–50.0)
HEMOGLOBIN: 12.3 g/dL — AB (ref 13.2–17.1)
Lymphs Abs: 1220 cells/uL (ref 850–3900)
MCH: 30.3 pg (ref 27.0–33.0)
MCHC: 33.7 g/dL (ref 32.0–36.0)
MCV: 89.9 fL (ref 80.0–100.0)
MONOS PCT: 13 %
MPV: 10.7 fL (ref 7.5–12.5)
NEUTROS ABS: 1908 {cells}/uL (ref 1500–7800)
Neutrophils Relative %: 47.7 %
PLATELETS: 275 10*3/uL (ref 140–400)
RBC: 4.06 10*6/uL — AB (ref 4.20–5.80)
RDW: 12.2 % (ref 11.0–15.0)
TOTAL LYMPHOCYTE: 30.5 %
WBC mixed population: 520 cells/uL (ref 200–950)
WBC: 4 10*3/uL (ref 3.8–10.8)

## 2018-02-11 LAB — COMPLETE METABOLIC PANEL WITH GFR
AG RATIO: 1.4 (calc) (ref 1.0–2.5)
ALKALINE PHOSPHATASE (APISO): 41 U/L (ref 40–115)
ALT: 16 U/L (ref 9–46)
AST: 22 U/L (ref 10–35)
Albumin: 4.1 g/dL (ref 3.6–5.1)
BILIRUBIN TOTAL: 0.9 mg/dL (ref 0.2–1.2)
BUN: 13 mg/dL (ref 7–25)
CHLORIDE: 102 mmol/L (ref 98–110)
CO2: 29 mmol/L (ref 20–32)
Calcium: 9.2 mg/dL (ref 8.6–10.3)
Creat: 1.17 mg/dL (ref 0.70–1.33)
GFR, EST NON AFRICAN AMERICAN: 69 mL/min/{1.73_m2} (ref >=60)
GFR, Est African American: 80 mL/min/{1.73_m2} (ref >=60)
Globulin: 2.9 g/dL (calc) (ref 1.9–3.7)
Glucose, Bld: 81 mg/dL (ref 65–99)
POTASSIUM: 4.4 mmol/L (ref 3.5–5.3)
SODIUM: 139 mmol/L (ref 135–146)
TOTAL PROTEIN: 7 g/dL (ref 6.1–8.1)

## 2018-02-11 LAB — HEPATITIS C ANTIBODY
Hepatitis C Ab: NONREACTIVE
SIGNAL TO CUT-OFF: 0.03 (ref <1.00)

## 2018-02-11 LAB — LIPID PANEL
CHOL/HDL RATIO: 2.2 (calc) (ref <5.0)
Cholesterol: 168 mg/dL (ref <200)
HDL: 76 mg/dL (ref >40)
LDL CHOLESTEROL (CALC): 81 mg/dL
Non-HDL Cholesterol (Calc): 92 mg/dL (calc) (ref <130)
TRIGLYCERIDES: 40 mg/dL (ref <150)

## 2018-02-11 LAB — HEMOGLOBIN A1C
EAG (MMOL/L): 6.2 (calc)
Hgb A1c MFr Bld: 5.5 % of total Hgb (ref <5.7)
Mean Plasma Glucose: 111 (calc)

## 2018-02-11 LAB — PSA, TOTAL WITH REFLEX TO PSA, FREE: PSA, TOTAL: 0.1 ng/mL (ref <=4.0)

## 2018-02-11 LAB — HIV ANTIBODY (ROUTINE TESTING W REFLEX): HIV 1&2 Ab, 4th Generation: NONREACTIVE

## 2018-02-15 ENCOUNTER — Ambulatory Visit (INDEPENDENT_AMBULATORY_CARE_PROVIDER_SITE_OTHER): Payer: BLUE CROSS/BLUE SHIELD | Admitting: Family Medicine

## 2018-02-15 ENCOUNTER — Encounter: Payer: Self-pay | Admitting: Family Medicine

## 2018-02-15 VITALS — BP 119/70 | HR 58 | Temp 98.3°F | Resp 16 | Ht 70.0 in | Wt 185.0 lb

## 2018-02-15 DIAGNOSIS — Z23 Encounter for immunization: Secondary | ICD-10-CM

## 2018-02-15 DIAGNOSIS — K5901 Slow transit constipation: Secondary | ICD-10-CM

## 2018-02-15 DIAGNOSIS — Z0000 Encounter for general adult medical examination without abnormal findings: Secondary | ICD-10-CM

## 2018-02-15 DIAGNOSIS — Z833 Family history of diabetes mellitus: Secondary | ICD-10-CM | POA: Diagnosis not present

## 2018-02-15 DIAGNOSIS — Z1211 Encounter for screening for malignant neoplasm of colon: Secondary | ICD-10-CM

## 2018-02-15 DIAGNOSIS — Z125 Encounter for screening for malignant neoplasm of prostate: Secondary | ICD-10-CM

## 2018-02-15 NOTE — Progress Notes (Signed)
Subjective:    Patient ID: Nicholas Weber, male    DOB: 09-09-1960, 57 y.o.   MRN: [id number]  Nicholas Weber is a 57 y.o. male presenting on 02/15/2018 for Annual Exam   HPI   Here for Annual Physical and Lab Review.  Lifestyle / Wellness Reports no new concerns today - Last result A1c elevated to 5.5 but still in normal range - Diet: improved now, more balanced, not following low carb diet, interested in diet advice - Fam history of DM - Drinks glass of wine nightly and some extra on weekends - Nicholas Weber is improving regular walking/running exercise - if has some wt gain will do more exercise  Mild Normocytic Anemia: Recent lab shows Hgb 12.3, do not have comparison, but patient reports prior known history of mild anemia, no problems with this in past. Denies any symptoms  Health Maintenance:  Due for Flu Shot, will receive today   Due for TDap - Nicholas Weber thinks less than 10 years, declines now  UTD Hep C and routine HIV screening 02/10/18 - negative.  Colon CA Screening: Last Colonoscopy (1st) 2014 approx (done by [facility] near [facility], think it was [employer]), results with no polyps, good for 10 years. Currently asymptomatic. No known family history of colon CA. Will request report, not due for repeat testing until 2025 as previously advised.  Prostate CA Screening: No prior prostate CA screening, Prior DRE reported normal. Last PSA 0.1 (01/2018). Currently asymptomatic. No known family history of prostate CA but possibly may have paternal uncle in age 25s with prostate problem Nicholas Weber will find out if cancer.   Depression screen [facility] 2/9 02/15/2018 09/08/2017 06/03/2016  Decreased Interest 0 0 0  Down, Depressed, Hopeless 0 0 0  PHQ - 2 Score 0 0 0    Past Medical History:  Diagnosis Date  . Bleeding external hemorrhoids 11/25/2016  . Constipation 11/25/2016   Past Surgical History:  Procedure Laterality Date  . COLONOSCOPY  2016   Social History    Socioeconomic History  . Marital status: Married    Spouse name: Not on file  . Number of children: Not on file  . Years of education: Not on file  . Highest education level: Not on file  Occupational History  . Not on file  Social Needs  . Financial resource strain: Not on file  . Food insecurity:    Worry: Not on file    Inability: Not on file  . Transportation needs:    Medical: Not on file    Non-medical: Not on file  Tobacco Use  . Smoking status: Never Smoker  . Smokeless tobacco: Never Used  Substance and Sexual Activity  . Alcohol use: Yes  . Drug use: No  . Sexual activity: Not on file  Lifestyle  . Physical activity:    Days per week: Not on file    Minutes per session: Not on file  . Stress: Not on file  Relationships  . Social connections:    Talks on phone: Not on file    Gets together: Not on file    Attends religious service: Not on file    Active member of club or organization: Not on file    Attends meetings of clubs or organizations: Not on file    Relationship status: Not on file  . Intimate partner violence:    Fear of current or ex partner: Not on file    Emotionally abused: Not  on file    Physically abused: Not on file    Forced sexual activity: Not on file  Other Topics Concern  . Not on file  Social History Narrative  . Not on file   Family History  Problem Relation Age of Onset  . Hypertension Mother   . Diabetes Father   . Prostate cancer Paternal Uncle 19       unsure exactly if cancer  . Colon cancer Neg Hx    Current Outpatient Medications on File Prior to Visit  Medication Sig  . baclofen (LIORESAL) 10 MG tablet Take 0.5-1 tablets (5-10 mg total) by mouth 2 (two) times daily as needed for muscle spasms. May take only at night if prefer.  . hydrocortisone (ANUSOL-HC) 25 MG suppository Place 1 suppository (25 mg total) rectally 2 (two) times daily as needed for hemorrhoids or anal itching. For 6 days for flare  . Multiple  Vitamin (MULTIVITAMIN) tablet Take 1 tablet by mouth daily.  . polyethylene glycol powder (GLYCOLAX/MIRALAX) powder Take 17-34 g by mouth daily as needed. May split dose into twice daily if needed. May use as maintenance   No current facility-administered medications on file prior to visit.     Review of Systems  Constitutional: Negative for activity change, appetite change, chills, diaphoresis, fatigue and fever.  HENT: Negative for congestion and hearing loss.   Eyes: Negative for visual disturbance.  Respiratory: Negative for apnea, cough, choking, chest tightness, shortness of breath and wheezing.   Cardiovascular: Negative for chest pain, palpitations and leg swelling.  Gastrointestinal: Positive for constipation. Negative for abdominal distention, abdominal pain, anal bleeding, blood in stool, diarrhea, nausea and vomiting.  Endocrine: Negative for cold intolerance.  Genitourinary: Negative for decreased urine volume, difficulty urinating, dysuria, frequency, hematuria and testicular pain.  Musculoskeletal: Negative for arthralgias, back pain and neck pain.  Skin: Negative for rash.  Allergic/Immunologic: Negative for environmental allergies.  Neurological: Negative for dizziness, weakness, light-headedness, numbness and headaches.  Hematological: Negative for adenopathy.  Psychiatric/Behavioral: Negative for behavioral problems, dysphoric mood and sleep disturbance. The patient is not nervous/anxious.    Per HPI unless specifically indicated above     Objective:    BP 119/70   Pulse (!) 58   Temp 98.3 F (36.8 C) (Oral)   Resp 16   Ht 5\' 10"  (1.778 m)   Wt 185 lb (83.9 kg)   BMI 26.54 kg/m   Wt Readings from Last 3 Encounters:  02/15/18 185 lb (83.9 kg)  10/26/17 183 lb (83 kg)  09/08/17 186 lb (84.4 kg)    Physical Exam  Constitutional: Nicholas Weber is oriented to person, place, and time. Nicholas Weber appears well-developed and well-nourished. No distress.  Well-appearing, comfortable,  cooperative  HENT:  Head: Normocephalic and atraumatic.  Mouth/Throat: Oropharynx is clear and moist.  Frontal / maxillary sinuses non-tender. Nares patent without purulence or edema. Bilateral TMs clear without erythema, effusion or bulging. Oropharynx clear without erythema, exudates, edema or asymmetry.  Eyes: Pupils are equal, round, and reactive to light. Conjunctivae and EOM are normal. Right eye exhibits no discharge. Left eye exhibits no discharge.  Neck: Normal range of motion. Neck supple. No thyromegaly present.  Cardiovascular: Normal rate, regular rhythm, normal heart sounds and intact distal pulses.  No murmur heard. Pulmonary/Chest: Effort normal and breath sounds normal. No respiratory distress. Nicholas Weber has no wheezes. Nicholas Weber has no rales.  Abdominal: Soft. Bowel sounds are normal. Nicholas Weber exhibits no distension and no mass. There is no tenderness.  Musculoskeletal:  Normal range of motion. Nicholas Weber exhibits no edema or tenderness.  Upper / Lower Extremities: - Normal muscle tone, strength bilateral upper extremities 5/5, lower extremities 5/5  Lymphadenopathy:    Nicholas Weber has no cervical adenopathy.  Neurological: Nicholas Weber is alert and oriented to person, place, and time.  Distal sensation intact to light touch all extremities  Skin: Skin is warm and dry. No rash noted. Nicholas Weber is not diaphoretic. No erythema.  Psychiatric: Nicholas Weber has a normal mood and affect. His behavior is normal.  Well groomed, good eye contact, normal speech and thoughts  Nursing note and vitals reviewed.  Results for orders placed or performed in visit on 02/10/18  HIV antibody  Result Value Ref Range   HIV 1&2 Ab, 4th Generation NON-REACTIVE NON-REACTI  Hepatitis C antibody  Result Value Ref Range   Hepatitis C Ab NON-REACTIVE NON-REACTI   SIGNAL TO CUT-OFF 0.03 <1.00  PSA, Total with Reflex to PSA, Free  Result Value Ref Range   PSA, Total 0.1 < OR = 4.0 ng/mL  Lipid panel  Result Value Ref Range   Cholesterol 168 <200 mg/dL    HDL 76 >16 mg/dL   Triglycerides 40 <109 mg/dL   LDL Cholesterol (Calc) 81 mg/dL (calc)   Total CHOL/HDL Ratio 2.2 <5.0 (calc)   Non-HDL Cholesterol (Calc) 92 <604 mg/dL (calc)  COMPLETE METABOLIC PANEL WITH GFR  Result Value Ref Range   Glucose, Bld 81 65 - 99 mg/dL   BUN 13 7 - 25 mg/dL   Creat 5.40 9.81 - 1.91 mg/dL   GFR, Est Non African American 69 > OR = 60 mL/min/1.59m2   GFR, Est African American 80 > OR = 60 mL/min/1.26m2   BUN/Creatinine Ratio NOT APPLICABLE 6 - 22 (calc)   Sodium 139 135 - 146 mmol/L   Potassium 4.4 3.5 - 5.3 mmol/L   Chloride 102 98 - 110 mmol/L   CO2 29 20 - 32 mmol/L   Calcium 9.2 8.6 - 10.3 mg/dL   Total Protein 7.0 6.1 - 8.1 g/dL   Albumin 4.1 3.6 - 5.1 g/dL   Globulin 2.9 1.9 - 3.7 g/dL (calc)   AG Ratio 1.4 1.0 - 2.5 (calc)   Total Bilirubin 0.9 0.2 - 1.2 mg/dL   Alkaline phosphatase (APISO) 41 40 - 115 U/L   AST 22 10 - 35 U/L   ALT 16 9 - 46 U/L  CBC with Differential/Platelet  Result Value Ref Range   WBC 4.0 3.8 - 10.8 Thousand/uL   RBC 4.06 (L) 4.20 - 5.80 Million/uL   Hemoglobin 12.3 (L) 13.2 - 17.1 g/dL   HCT 47.8 (L) 29.5 - 62.1 %   MCV 89.9 80.0 - 100.0 fL   MCH 30.3 27.0 - 33.0 pg   MCHC 33.7 32.0 - 36.0 g/dL   RDW 30.8 65.7 - 84.6 %   Platelets 275 140 - 400 Thousand/uL   MPV 10.7 7.5 - 12.5 fL   Neutro Abs 1,908 1,500 - 7,800 cells/uL   Lymphs Abs 1,220 850 - 3,900 cells/uL   WBC mixed population 520 200 - 950 cells/uL   Eosinophils Absolute 272 15 - 500 cells/uL   Basophils Absolute 80 0 - 200 cells/uL   Neutrophils Relative % 47.7 %   Total Lymphocyte 30.5 %   Monocytes Relative 13.0 %   Eosinophils Relative 6.8 %   Basophils Relative 2.0 %  Hemoglobin A1c  Result Value Ref Range   Hgb A1c MFr Bld 5.5 <5.7 % of total Hgb  Mean Plasma Glucose 111 (calc)   eAG (mmol/L) 6.2 (calc)      Assessment & Plan:   Problem List Items Addressed This Visit    Constipation Stable, baseline On Miralax, Fiber, improving  hydration Has some hemorrhoids, flared by constipation Now currently asymptomatic.     Other Visit Diagnoses    Annual physical exam    -  Primary Updated Health Maintenance information - Flu shot today - Request outside record colonoscopy 2015 Reviewed recent lab results with patient Encouraged improvement to lifestyle with diet and exercise - Goal of weight loss - maintain    Needs flu shot       Relevant Orders   Flu Vaccine QUAD 36+ mos IM (Completed)   Family history of diabetes mellitus       Screening for prostate cancer     Reviewed prostate cancer screening guidelines and risks including potential prostate biopsy if abnormal PSA, proceed with yearly PSA for now, and anticipate DRE as needed especially if abnormal PSA or new symptoms    Screen for colon cancer          No orders of the defined types were placed in this encounter.  Follow up plan: Return in about 1 year (around 02/16/2019) for Annual Physical.  Future labs ordered for 02/11/19  Saralyn Pilar, DO [facility] 02/15/2018, 2:58 PM

## 2018-02-15 NOTE — Patient Instructions (Addendum)
Thank you for coming to the office today.  Keep up the great work overall.  Flu shot today  In future we may get Tdap tetanus vaccine as well.  Here are blood results - keep trying to improve LOW CARB LOW SUGAR diet and we can check sugar A1c yearly  1. Chemistry - Normal results, including electrolytes, kidney and liver function. Normal fasting blood sugar   2. Hemoglobin A1c (Diabetes screening) - 5.5, normal not in range of Pre-Diabetes (>5.7 to 6.4)   3. Routine screening HIV and Hepatitis C - Both Negative.  4. PSA Prostate Cancer Screening - 0.1, negative.  5. Cholesterol - Normal cholesterol results. Including HDL (good cholesterol), LDL (bad cholesterol), and Triglycerides.  6. CBC Blood Counts - Mild low hemoglobin, otherwise normal, no anemia, other abnormality  DUE for FASTING BLOOD WORK (no food or drink after midnight before the lab appointment, only water or coffee without cream/sugar on the morning of)  SCHEDULE "Lab Only" visit in the morning at the clinic for lab draw in 6 MONTHS   - Make sure Lab Only appointment is at about 1 week before your next appointment, so that results will be available  For Lab Results, once available within 2-3 days of blood draw, you can can log in to MyChart online to view your results and a brief explanation. Also, we can discuss results at next follow-up visit.   Please schedule a Follow-up Appointment to: Return in about 1 year (around 02/16/2019) for Annual Physical.  If you have any other questions or concerns, please feel free to call the office or send a message through MyChart. You may also schedule an earlier appointment if necessary.  Additionally, you may be receiving a survey about your experience at our office within a few days to 1 week by e-mail or mail. We value your feedback.  Saralyn Pilar, DO [facility], [place]

## 2018-02-16 ENCOUNTER — Other Ambulatory Visit: Payer: Self-pay | Admitting: Family Medicine

## 2018-02-16 DIAGNOSIS — Z0000 Encounter for general adult medical examination without abnormal findings: Secondary | ICD-10-CM

## 2018-02-16 DIAGNOSIS — R7309 Other abnormal glucose: Secondary | ICD-10-CM

## 2018-02-16 DIAGNOSIS — Z125 Encounter for screening for malignant neoplasm of prostate: Secondary | ICD-10-CM

## 2018-02-16 DIAGNOSIS — D649 Anemia, unspecified: Secondary | ICD-10-CM

## 2018-02-16 DIAGNOSIS — R7989 Other specified abnormal findings of blood chemistry: Secondary | ICD-10-CM | POA: Insufficient documentation

## 2018-05-23 ENCOUNTER — Encounter: Payer: Self-pay | Admitting: Emergency Medicine

## 2018-05-23 ENCOUNTER — Other Ambulatory Visit: Payer: Self-pay

## 2018-05-23 ENCOUNTER — Emergency Department
Admission: EM | Admit: 2018-05-23 | Discharge: 2018-05-23 | Disposition: A | Discharge status: Home / Self Care | Payer: BLUE CROSS/BLUE SHIELD | Attending: Emergency Medicine | Admitting: Emergency Medicine

## 2018-05-23 DIAGNOSIS — Z79899 Other long term (current) drug therapy: Secondary | ICD-10-CM | POA: Diagnosis not present

## 2018-05-23 DIAGNOSIS — R059 Cough, unspecified: Secondary | ICD-10-CM

## 2018-05-23 DIAGNOSIS — R51 Headache: Secondary | ICD-10-CM | POA: Insufficient documentation

## 2018-05-23 DIAGNOSIS — R509 Fever, unspecified: Secondary | ICD-10-CM | POA: Diagnosis not present

## 2018-05-23 DIAGNOSIS — R05 Cough: Secondary | ICD-10-CM | POA: Diagnosis not present

## 2018-05-23 DIAGNOSIS — R5081 Fever presenting with conditions classified elsewhere: Secondary | ICD-10-CM

## 2018-05-23 DIAGNOSIS — R52 Pain, unspecified: Secondary | ICD-10-CM

## 2018-05-23 DIAGNOSIS — J101 Influenza due to other identified influenza virus with other respiratory manifestations: Secondary | ICD-10-CM | POA: Diagnosis not present

## 2018-05-23 DIAGNOSIS — R519 Headache, unspecified: Secondary | ICD-10-CM

## 2018-05-23 LAB — INFLUENZA PANEL BY PCR (TYPE A & B)
Influenza A By PCR: POSITIVE — AB
Influenza B By PCR: NEGATIVE

## 2018-05-23 MED ORDER — ACETAMINOPHEN 325 MG PO TABS
650.0000 mg | ORAL_TABLET | Freq: Once | ORAL | Status: AC | PRN
  Administered 2018-05-23: 650 mg via ORAL
  Filled 2018-05-23: qty 2

## 2018-05-23 MED ORDER — OSELTAMIVIR PHOSPHATE 75 MG PO CAPS: 75.0000 mg | ORAL_CAPSULE | Freq: Two times a day (BID) | ORAL | 0 refills | Status: DC

## 2018-05-23 NOTE — ED Triage Notes (Signed)
Pt arrives with multiple complaints including productive cough, fever, and intermittent headache. Pt ambulates to room with a steady gait, in NAD. Pt reports OTC medication administration with minimal relief.

## 2018-05-23 NOTE — Discharge Instructions (Addendum)
You have been diagnosed with the flu.  I am prescribing you Tamiflu twice daily for the next 5 days.  Get rest and drink plenty of fluids.  You can alternate ibuprofen and Tylenol as needed for body aches.  I have provided you with a work note.

## 2018-05-23 NOTE — ED Provider Notes (Signed)
[facility] Emergency Department Provider Note ____________________________________________  Time seen: 1800  I have reviewed the triage vital signs and the nursing notes.  HISTORY  Chief Complaint  Cough   HPI Nicholas Weber is a 58 y.o. male.  Presents to the ER today with complaint of headache, cough and fever.  He reports this started yesterday.  The headache is located in the forehead.  He describes the pain as pressure.  He denies dizziness or visual changes.  The cough is productive of yellow mucus.  He denies ear pain, runny nose, nasal congestion, sore throat or shortness of breath.  He reports fever, chills and body aches.  He has taken Mucinex and Alka-Seltzer OTC with minimal relief.  He has not had sick contacts that he is aware of.  His flu shot is up-to-date.  Past Medical History:  Diagnosis Date  . Bleeding external hemorrhoids 11/25/2016  . Constipation 11/25/2016    Patient Active Problem List   Diagnosis Date Noted  . Elevated serum creatinine 02/16/2018  . Normocytic anemia 02/16/2018  . Bleeding external hemorrhoids 11/25/2016  . Constipation 11/25/2016    Past Surgical History:  Procedure Laterality Date  . COLONOSCOPY  2016    Prior to Admission medications   Medication Sig Start Date End Date Taking? Authorizing Provider  baclofen (LIORESAL) 10 MG tablet Take 0.5-1 tablets (5-10 mg total) by mouth 2 (two) times daily as needed for muscle spasms. May take only at night if prefer. 08/26/16   Karamalegos, Netta Neat, DO  hydrocortisone (ANUSOL-HC) 25 MG suppository Place 1 suppository (25 mg total) rectally 2 (two) times daily as needed for hemorrhoids or anal itching. For 6 days for flare 11/25/16   Smitty Cords, DO  Multiple Vitamin (MULTIVITAMIN) tablet Take 1 tablet by mouth daily.    [provider]  oseltamivir (TAMIFLU) 75 MG capsule Take 1 capsule (75 mg total) by mouth 2 (two) times daily. 05/23/18    Lorre Munroe, NP  polyethylene glycol powder (GLYCOLAX/MIRALAX) powder Take 17-34 g by mouth daily as needed. May split dose into twice daily if needed. May use as maintenance 11/25/16   Smitty Cords, DO    Allergies Patient has no known allergies.  Family History  Problem Relation Age of Onset  . Hypertension Mother   . Diabetes Father   . Prostate cancer Paternal Uncle 48       unsure exactly if cancer  . Colon cancer Neg Hx     Social History Social History   Tobacco Use  . Smoking status: Never Smoker  . Smokeless tobacco: Never Used  Substance Use Topics  . Alcohol use: Yes  . Drug use: No    Review of Systems  Constitutional: Positive for fever, chills or body aches.  Eyes: Negative for visual changes. ENT: Negative for runny nose, nasal congestion, ear pain or sore throat. Cardiovascular: Negative for chest pain. Respiratory: Positive for cough.  Negative for shortness of breath. Gastrointestinal: Negative for nausea vomiting and diarrhea. Genitourinary: Negative for dysuria, urgency, frequency or blood in his urine. Skin: Negative for rash. Neurological: Positive for headache.  Negative for focal weakness or numbness. ____________________________________________  PHYSICAL EXAM:  VITAL SIGNS: ED Triage Vitals  Enc Vitals Group     BP 05/23/18 1738 120/65     Pulse Rate 05/23/18 1735 88     Resp 05/23/18 1735 20     Temp 05/23/18 1735 (!) 103.2 F (39.6 C)  Temp Source 05/23/18 1735 Oral     SpO2 05/23/18 1735 96 %     Weight 05/23/18 1737 182 lb (82.6 kg)     Height 05/23/18 1737 5\' 10"  (1.778 m)     Head Circumference --      Peak Flow --      Pain Score 05/23/18 1737 5     Pain Loc --      Pain Edu? --      Excl. in GC? --     Constitutional: Alert and oriented.  Ill-appearing but in no distress. Head: Normocephalic without sinus tenderness. Ears: Canals clear. TMs intact bilaterally. Nose: No  congestion/rhinorrhea/epistaxis. Mouth/Throat: Mucous membranes are moist.  No posterior pharynx erythema or exudate. Hematological/Lymphatic/Immunological: No cervical lymphadenopathy. Cardiovascular: Normal rate, regular rhythm.  Respiratory: Normal respiratory effort. No wheezes/rales/rhonchi. Gastrointestinal: Soft and nontender.   Neurologic:  Normal speech and language. No gross focal neurologic deficits are appreciated. Skin:   No rash noted.  ____________________________________________   LABS    Lab Orders     Influenza panel by PCR (type A & B) ____________________________________________   INITIAL IMPRESSION / ASSESSMENT AND PLAN / ED COURSE  Headache, Cough, Fever, Chills, Body Aches:  DDX  Include influenza, viral URI with Cough, Pneumonia: Rapid flu: positive for A Tylenol 650 mg 1 tab PO in ER RX for Tamiflu 75 mg BID x 5 days Get rest and drink plenty of fluids Alternate Ibuprofen and Tylenol as needed for fever and body aches.  ____________________________________________  FINAL CLINICAL IMPRESSION(S) / ED DIAGNOSES  Final diagnoses:  Acute intractable headache, unspecified headache type  Cough  Fever in other diseases  Body aches  Influenza A   Nicki Reaperegina Coady Train, NP    Lorre MunroeBaity, Shine Scrogham W, NP 05/23/18 1916    Nita SickleVeronese, [place], MD 05/24/18 1534

## 2018-06-16 ENCOUNTER — Encounter: Payer: Self-pay | Admitting: Family Medicine

## 2018-06-16 ENCOUNTER — Ambulatory Visit (INDEPENDENT_AMBULATORY_CARE_PROVIDER_SITE_OTHER): Payer: BLUE CROSS/BLUE SHIELD | Admitting: Family Medicine

## 2018-06-16 ENCOUNTER — Other Ambulatory Visit: Payer: Self-pay

## 2018-06-16 VITALS — BP 116/62 | HR 71 | Temp 98.4°F | Resp 16 | Ht 70.0 in | Wt 182.0 lb

## 2018-06-16 DIAGNOSIS — M7701 Medial epicondylitis, right elbow: Secondary | ICD-10-CM | POA: Diagnosis not present

## 2018-06-16 MED ORDER — MELOXICAM 15 MG PO TABS: 15.0000 mg | ORAL_TABLET | Freq: Every day | ORAL | 1 refills | Status: DC | PRN

## 2018-06-16 NOTE — Patient Instructions (Addendum)
Thank you for coming to the office today.  You most likely have "Golfer Elbow" or "Medial Epicondylitis" - This is inflammation or tendonitis affecting the muscles of your forearm - Usually it is caused by frequent or daily repetitive activities using your arms, lifting, rotating, pulling, twisting - it can happen over days to months or longer from repetitive strain  One of the most important parts of treatment is rest and avoiding the activities that make it worse.  Recommend trial of Anti-inflammatory with Meloxicam 15mg  tabs - take one with food and plenty of water ONCE daily every day (breakfast and dinner), as needed - in future can take up to 1 week - DO NOT TAKE any ibuprofen, aleve, motrin while you are taking this medicine - It is safe to take Tylenol Ext Str 500mg  tabs - take 1 to 2 (max dose 1000mg ) every 6 hours as needed for breakthrough pain, max 24 hour daily dose is 6 to 8 tablets or 4000mg   Use RICE therapy: - R - Rest / relative rest with activity modification avoid overuse of joint - I - Ice packs (make sure you use a towel or sock / something to protect skin) - C - Compression with ACE wrap to apply pressure and reduce swelling allowing more support ---------------------------------------------------  Can try a sleeve on elbow to protect it  At night please try to wrap the elbow in a larger towel to limit arm elbow bending overnight that puts strain on it.  -------------------------------  This may not help your problem but for tennis elbow this strap can be helpful - it may be an option for you  Try a Forearm Tennis Elbow Strap over muscle as demonstrated - use with repetitive activity to reduce strain of muscle       Please schedule a Follow-up Appointment to: Return in about 6 weeks (around 07/28/2018), or if symptoms worsen or fail to improve, for elbow pain as needed.  If you have any other questions or concerns, please feel free to call the office or send a  message through MyChart. You may also schedule an earlier appointment if necessary.  Additionally, you may be receiving a survey about your experience at our office within a few days to 1 week by e-mail or mail. We value your feedback.  Saralyn Pilar, DO [facility], [place]

## 2018-06-16 NOTE — Progress Notes (Signed)
Subjective:    Patient ID: Nicholas Weber, male    DOB: 1960-08-08, 58 y.o.   MRN: [id number]  Nicholas Weber is a 58 y.o. male presenting on 06/16/2018 for Elbow Pain (right side  onset Friday Meloxicam taken Saturday and pain improved on Sunday btw went to ED was diagnosed with Flu everything improved but still has runny nose )   HPI   RIGHT ELBOW PAIN Reports new problem onset last week about 5-6 days ago developed soreness and pain in R elbow region forearm, it seemed onset after work, he does repetitive actions at work with his arms, he is R handed, often lifting and moving, he took a meloxicam he had leftover with very good results next 1-2 days was nearly resolved. He felt some swelling in forearm but did not see any - Denies any acute injury, redness, other joint problem or pain, numbness tingling weakness   Depression screen [facility] 2/9 06/16/2018 02/15/2018 09/08/2017  Decreased Interest 0 0 0  Down, Depressed, Hopeless 0 0 0  PHQ - 2 Score 0 0 0    Social History   Tobacco Use  . Smoking status: Never Smoker  . Smokeless tobacco: Never Used  Substance Use Topics  . Alcohol use: Yes  . Drug use: No    Review of Systems Per HPI unless specifically indicated above     Objective:    BP 116/62   Pulse 71   Temp 98.4 F (36.9 C) (Oral)   Resp 16   Ht 5\' 10"  (1.778 m)   Wt 182 lb (82.6 kg)   SpO2 98%   BMI 26.11 kg/m   Wt Readings from Last 3 Encounters:  06/16/18 182 lb (82.6 kg)  05/23/18 182 lb (82.6 kg)  02/15/18 185 lb (83.9 kg)    Physical Exam Vitals signs and nursing note reviewed.  Constitutional:      General: He is not in acute distress.    Appearance: He is well-developed. He is not diaphoretic.     Comments: Well-appearing, comfortable, cooperative  HENT:     Head: Normocephalic and atraumatic.  Eyes:     General:        Right eye: No discharge.        Left eye: No discharge.     Conjunctiva/sclera: Conjunctivae normal.    Cardiovascular:     Rate and Rhythm: Normal rate.  Pulmonary:     Effort: Pulmonary effort is normal.  Musculoskeletal: Normal range of motion.        General: No swelling or tenderness.     Comments: Right Elbow upper ext Inspection: normal appearance Palpation: non tender olecranon and surrounding muscle tendons ROM: full active range of motion flex ext supination pronation Special Testing: Tinel's ulnar nerve negative Strength: intact 5/5 bicep grip Neurovascular: distal intact   Skin:    General: Skin is warm and dry.     Findings: No erythema or rash.  Neurological:     Mental Status: He is alert and oriented to person, place, and time.  Psychiatric:        Behavior: Behavior normal.     Comments: Well groomed, good eye contact, normal speech and thoughts        Assessment & Plan:   Problem List Items Addressed This Visit    None    Visit Diagnoses    Medial epicondylitis of elbow, right    -  Primary   Relevant Medications   meloxicam (MOBIC) 15  MG tablet      Seems mostly resolved already Clinically R forearm tendonitis, more specifically medial epicondylitis given location and provoking factors on exam - Likely acute on chronic flare due to repetitive daily activity at work - Unlikely to be acute muscle tear or fracture - No imaging on file  Plan - Reviewed precautions with diagnosis and goal to allow it to heal / modify activity - Start Meloxicam NSAID 15mg  daily wc 1-2 weeks, then PRN - Take Tylenol up to 1g TID PRN breakthrough - Recommend RICE therapy, emphasis on R elbow sleeve support vs Forearm Strap to help support the muscle limit repetitive strain, may use ACE wrap for compression Follow up if not improved  Meds ordered this encounter  Medications  . meloxicam (MOBIC) 15 MG tablet    Sig: Take 1 tablet (15 mg total) by mouth daily as needed for pain.    Dispense:  30 tablet    Refill:  1     Follow up plan: Return in about 6 weeks  (around 07/28/2018), or if symptoms worsen or fail to improve, for elbow pain as needed.   Saralyn Pilar, DO [facility] 06/16/2018, 4:05 PM

## 2018-06-17 ENCOUNTER — Encounter: Payer: Self-pay | Admitting: Family Medicine

## 2018-09-27 ENCOUNTER — Ambulatory Visit (INDEPENDENT_AMBULATORY_CARE_PROVIDER_SITE_OTHER): Payer: BLUE CROSS/BLUE SHIELD | Admitting: Family Medicine

## 2018-09-27 ENCOUNTER — Encounter: Payer: Self-pay | Admitting: Family Medicine

## 2018-09-27 ENCOUNTER — Other Ambulatory Visit: Payer: Self-pay

## 2018-09-27 DIAGNOSIS — B354 Tinea corporis: Secondary | ICD-10-CM | POA: Diagnosis not present

## 2018-09-27 DIAGNOSIS — R21 Rash and other nonspecific skin eruption: Secondary | ICD-10-CM

## 2018-09-27 MED ORDER — CICLOPIROX OLAMINE 0.77 % EX CREA: TOPICAL_CREAM | Freq: Two times a day (BID) | CUTANEOUS | 0 refills | Status: DC

## 2018-09-27 MED ORDER — TRIAMCINOLONE ACETONIDE 0.1 % EX CREA: 1.0000 "application " | TOPICAL_CREAM | Freq: Two times a day (BID) | CUTANEOUS | 0 refills | Status: DC | PRN

## 2018-09-27 NOTE — Patient Instructions (Addendum)
The rash looks most consistent with ringworm or possible eczema skin reaction, this can flare up and get worse due to a variety of factors (excessive dry skin from bathing/showering, soaps, cold weather / indoor heaters, outdoor exposures).  Use the anti fungal cream Ciclopirox 0.77% twice a day every day up to 4 weeks  Space apart the other cream - Triamcinolone -  topical steroid creams twice a day for up to 1 to 2 week, maximum duration of use per one flare is 10 to 14 days, then STOP using it and allow skin to recover. Caution with over-use may cause lightening of the skin.   Triamcinolone / Kenalog on body only - do NOT use on face.  Tips to reduce Eczema Flares: For baths/showers, limit bathing to every other day if you can (max 1 x daily)  Use a gentle, unscented soap and lukewarm water (hot water is most irritating to skin) Never scrub skin with too much pressure, this causes more irritation. Pat skin dry, then leave it slightly damp. DO NOT scrub it dry. Apply steroid cream to skin and rub in all the way, wait 15 min, then apply a daily moisturizer (Vaseline, Eucerin, Aveeno). Continue daily moisturizer every day of the year (even after flare is resolved) - If you have eczema on hands or dry hands, recommend wearing any type of gloves overnight (cloth, fabric, or even nitrile/latex) to improve effect of topical moisturizer  If develops redness, honey colored crust oozing, drainage of pus, bleeding, or redness / swelling, pain, please return for re-evaluation, may have become infected after scratching.   Please schedule a Follow-up Appointment to: Return in about 4 weeks (around 10/25/2018), or if symptoms worsen or fail to improve, for rash.  If you have any other questions or concerns, please feel free to call the office or send a message through MyChart. You may also schedule an earlier appointment if necessary.  Additionally, you may be receiving a survey about your experience at  our office within a few days to 1 week by e-mail or mail. We value your feedback.  Saralyn Pilar, DO [facility], [place]

## 2018-09-27 NOTE — Progress Notes (Signed)
Virtual Visit via Telephone The purpose of this virtual visit is to provide medical care while limiting exposure to the novel coronavirus (COVID19) for both patient and office staff.  Consent was obtained for phone visit:  Yes.   Answered questions that patient had about telehealth interaction:  Yes.   I discussed the limitations, risks, security and privacy concerns of performing an evaluation and management service by telephone. I also discussed with the patient that there may be a patient responsible charge related to this service. The patient expressed understanding and agreed to proceed.  Patient Location: Home Provider Location: [facility])  ---------------------------------------------------------------------- Chief Complaint  Patient presents with  . Recurrent Skin Infections    may be ringworm onset couple of weeks spot on neck, itchy patient email the picture    S: Reviewed CMA documentation. I have called patient and gathered additional HPI as follows:  RASH Reports that symptoms started about 1-2 weeks ago he woke up in AM was itching on side of neck, he did not think much of it, it developed a scab and he scratched it and it did not resolve, seemed to have skin spot that was mildly swollen. - Tried OTC neosporin with mild relief  Denies any high risk travel to areas of current concern for COVID19. Denies any known or suspected exposure to person with or possibly with COVID19.  Denies any fevers, chills, sweats, body ache, cough, shortness of breath, sinus pain or pressure, headache, abdominal pain, diarrhea  Past Medical History:  Diagnosis Date  . Bleeding external hemorrhoids 11/25/2016  . Constipation 11/25/2016   Social History   Tobacco Use  . Smoking status: Never Smoker  . Smokeless tobacco: Never Used  Substance Use Topics  . Alcohol use: Yes  . Drug use: No    Current Outpatient Medications:  .  meloxicam (MOBIC) 15 MG tablet,  Take 1 tablet (15 mg total) by mouth daily as needed for pain., Disp: 30 tablet, Rfl: 1 .  Multiple Vitamin (MULTIVITAMIN) tablet, Take 1 tablet by mouth daily., Disp: , Rfl:  .  ciclopirox (LOPROX) 0.77 % cream, Apply topically 2 (two) times daily. For up to 4 weeks, Disp: 30 g, Rfl: 0 .  triamcinolone cream (KENALOG) 0.1 %, Apply 1 application topically 2 (two) times daily as needed. For up to 1-2 weeks, Disp: 30 g, Rfl: 0  Depression screen [facility] 2/9 09/27/2018 06/16/2018 02/15/2018  Decreased Interest 0 0 0  Down, Depressed, Hopeless 0 0 0  PHQ - 2 Score 0 0 0    No flowsheet data found.  -------------------------------------------------------------------------- O: No physical exam performed due to remote telephone encounter.  Reviewed picture from secure e-mail sent to our office email. Described as left sided localized raised almost annular rash with some dry skin appearance, no erythema, no drainage, no ulceration.  No results found for this or any previous visit (from the past 2160 hour(s)).  -------------------------------------------------------------------------- A&P:  Problem List Items Addressed This Visit    None    Visit Diagnoses    Tinea corporis    -  Primary   Relevant Medications   ciclopirox (LOPROX) 0.77 % cream   Rash and nonspecific skin eruption       Relevant Medications   triamcinolone cream (KENALOG) 0.1 %     Clinically with tinea corporis on neck  Plan Treat with topical ciclopirox up to 2-4 weeks, also add triamcionlone cream PRN flare itching Consider Dermatology in future if significant worsening or  change Return criteria given today  Meds ordered this encounter  Medications  . ciclopirox (LOPROX) 0.77 % cream    Sig: Apply topically 2 (two) times daily. For up to 4 weeks    Dispense:  30 g    Refill:  0  . triamcinolone cream (KENALOG) 0.1 %    Sig: Apply 1 application topically 2 (two) times daily as needed. For up to 1-2 weeks     Dispense:  30 g    Refill:  0    Follow-up: - Return in 4 weeks as needed rash  Patient verbalizes understanding with the above medical recommendations including the limitation of remote medical advice.  Specific follow-up and call-back criteria were given for patient to follow-up or seek medical care more urgently if needed.   - Time spent in direct consultation with patient on phone: 9 minutes  Saralyn PilarAlexander Devere Brem, DO [facility] 09/27/2018, 4:11 PM

## 2019-01-20 DIAGNOSIS — Z20828 Contact with and (suspected) exposure to other viral communicable diseases: Secondary | ICD-10-CM | POA: Diagnosis not present

## 2019-02-10 ENCOUNTER — Telehealth: Payer: Self-pay | Admitting: Family Medicine

## 2019-02-10 DIAGNOSIS — Z0000 Encounter for general adult medical examination without abnormal findings: Secondary | ICD-10-CM

## 2019-02-10 DIAGNOSIS — Z125 Encounter for screening for malignant neoplasm of prostate: Secondary | ICD-10-CM

## 2019-02-10 DIAGNOSIS — D649 Anemia, unspecified: Secondary | ICD-10-CM

## 2019-02-10 DIAGNOSIS — R7309 Other abnormal glucose: Secondary | ICD-10-CM

## 2019-02-10 DIAGNOSIS — Z13220 Encounter for screening for lipoid disorders: Secondary | ICD-10-CM

## 2019-02-10 NOTE — Telephone Encounter (Signed)
Orders signed for annual physical  Nobie Putnam, [facility] 02/10/2019, 1:00 PM

## 2019-02-11 ENCOUNTER — Other Ambulatory Visit: Payer: Self-pay

## 2019-02-11 ENCOUNTER — Other Ambulatory Visit: Payer: BC Managed Care – PPO

## 2019-02-11 DIAGNOSIS — Z125 Encounter for screening for malignant neoplasm of prostate: Secondary | ICD-10-CM | POA: Diagnosis not present

## 2019-02-11 DIAGNOSIS — D649 Anemia, unspecified: Secondary | ICD-10-CM | POA: Diagnosis not present

## 2019-02-11 DIAGNOSIS — Z13220 Encounter for screening for lipoid disorders: Secondary | ICD-10-CM | POA: Diagnosis not present

## 2019-02-11 DIAGNOSIS — Z20828 Contact with and (suspected) exposure to other viral communicable diseases: Secondary | ICD-10-CM | POA: Diagnosis not present

## 2019-02-11 DIAGNOSIS — R7309 Other abnormal glucose: Secondary | ICD-10-CM | POA: Diagnosis not present

## 2019-02-11 DIAGNOSIS — Z20822 Contact with and (suspected) exposure to covid-19: Secondary | ICD-10-CM

## 2019-02-11 DIAGNOSIS — Z0000 Encounter for general adult medical examination without abnormal findings: Secondary | ICD-10-CM | POA: Diagnosis not present

## 2019-02-12 LAB — COMPLETE METABOLIC PANEL WITH GFR
AG Ratio: 1.8 (calc) (ref 1.0–2.5)
ALT: 12 U/L (ref 9–46)
AST: 18 U/L (ref 10–35)
Albumin: 4.2 g/dL (ref 3.6–5.1)
Alkaline phosphatase (APISO): 42 U/L (ref 35–144)
BUN: 17 mg/dL (ref 7–25)
CO2: 28 mmol/L (ref 20–32)
Calcium: 8.8 mg/dL (ref 8.6–10.3)
Chloride: 104 mmol/L (ref 98–110)
Creat: 0.97 mg/dL (ref 0.70–1.33)
GFR, Est African American: 100 mL/min/{1.73_m2} (ref >=60)
GFR, Est Non African American: 86 mL/min/{1.73_m2} (ref >=60)
Globulin: 2.4 g/dL (calc) (ref 1.9–3.7)
Glucose, Bld: 90 mg/dL (ref 65–99)
Potassium: 4.4 mmol/L (ref 3.5–5.3)
Sodium: 138 mmol/L (ref 135–146)
Total Bilirubin: 0.7 mg/dL (ref 0.2–1.2)
Total Protein: 6.6 g/dL (ref 6.1–8.1)

## 2019-02-12 LAB — CBC WITH DIFFERENTIAL/PLATELET
Absolute Monocytes: 423 cells/uL (ref 200–950)
Basophils Absolute: 70 cells/uL (ref 0–200)
Basophils Relative: 2.4 %
Eosinophils Absolute: 212 cells/uL (ref 15–500)
Eosinophils Relative: 7.3 %
HCT: 39.6 % (ref 38.5–50.0)
Hemoglobin: 13.2 g/dL (ref 13.2–17.1)
Lymphs Abs: 1285 cells/uL (ref 850–3900)
MCH: 29.9 pg (ref 27.0–33.0)
MCHC: 33.3 g/dL (ref 32.0–36.0)
MCV: 89.8 fL (ref 80.0–100.0)
MPV: 10.7 fL (ref 7.5–12.5)
Monocytes Relative: 14.6 %
Neutro Abs: 911 cells/uL — ABNORMAL LOW (ref 1500–7800)
Neutrophils Relative %: 31.4 %
Platelets: 257 10*3/uL (ref 140–400)
RBC: 4.41 10*6/uL (ref 4.20–5.80)
RDW: 13.4 % (ref 11.0–15.0)
Total Lymphocyte: 44.3 %
WBC: 2.9 10*3/uL — ABNORMAL LOW (ref 3.8–10.8)

## 2019-02-12 LAB — HEMOGLOBIN A1C
Hgb A1c MFr Bld: 5.5 % of total Hgb (ref <5.7)
Mean Plasma Glucose: 111 (calc)
eAG (mmol/L): 6.2 (calc)

## 2019-02-12 LAB — LIPID PANEL
Cholesterol: 162 mg/dL (ref <200)
HDL: 63 mg/dL (ref >=40)
LDL Cholesterol (Calc): 86 mg/dL (calc)
Non-HDL Cholesterol (Calc): 99 mg/dL (calc) (ref <130)
Total CHOL/HDL Ratio: 2.6 (calc) (ref <5.0)
Triglycerides: 53 mg/dL (ref <150)

## 2019-02-12 LAB — PSA: PSA: 0.4 ng/mL (ref <=4.0)

## 2019-02-13 LAB — NOVEL CORONAVIRUS, NAA: SARS-CoV-2, NAA: NOT DETECTED

## 2019-02-18 ENCOUNTER — Ambulatory Visit (INDEPENDENT_AMBULATORY_CARE_PROVIDER_SITE_OTHER): Payer: BC Managed Care – PPO | Admitting: Family Medicine

## 2019-02-18 ENCOUNTER — Encounter: Payer: Self-pay | Admitting: Family Medicine

## 2019-02-18 ENCOUNTER — Other Ambulatory Visit: Payer: Self-pay

## 2019-02-18 VITALS — BP 127/77 | HR 59 | Temp 98.4°F | Resp 16 | Ht 70.0 in | Wt 185.0 lb

## 2019-02-18 DIAGNOSIS — Z23 Encounter for immunization: Secondary | ICD-10-CM

## 2019-02-18 DIAGNOSIS — D709 Neutropenia, unspecified: Secondary | ICD-10-CM | POA: Insufficient documentation

## 2019-02-18 DIAGNOSIS — Z0000 Encounter for general adult medical examination without abnormal findings: Secondary | ICD-10-CM | POA: Diagnosis not present

## 2019-02-18 NOTE — Patient Instructions (Addendum)
Thank you for coming to the office today.  Lab results look good  Only issue low white blood cell count, we can check this again in 1 year. No other concerns.  Flu shot today  Colon Cancer Screening: - For all adults age 57+ routine colon cancer screening is highly recommended.     - Recent guidelines from [place] recommend starting age of 74 - Early detection of colon cancer is important, because often there are no warning signs or symptoms, also if found early usually it can be cured. Late stage is hard to treat.  - If you are not interested in Colonoscopy screening (if done and normal you could be cleared for 5 to 10 years until next due), then Cologuard is an excellent alternative for screening test for Colon Cancer. It is highly sensitive for detecting DNA of colon cancer from even the earliest stages. Also, there is NO bowel prep required. - If Cologuard is NEGATIVE, then it is good for 3 years before next due - If Cologuard is POSITIVE, then it is strongly advised to get a Colonoscopy, which allows the GI doctor to locate the source of the cancer or polyp (even very early stage) and treat it by removing it. ------------------------- If you would like to proceed with Cologuard (stool DNA test) - FIRST, call your insurance company and tell them you want to check cost of Cologuard tell them CPT Code [phone] (it may be completely covered and you could get for no cost, OR max cost without any coverage is about $600). Also, keep in mind if you do NOT open the kit, and decide not to do the test, you will NOT be charged, you should contact the company if you decide not to do the test. - If you want to proceed, you can notify us (phone message, [place], or at next visit) and we will order it for you. The test kit will be delivered to you house within about 1 week. Follow instructions to collect sample, you may call the company for any help or questions, 24/7 telephone support  at [phone].    Please schedule a Follow-up Appointment to: Return in about 1 year (around 02/18/2020) for Annual Physical.  If you have any other questions or concerns, please feel free to call the office or send a message through [place]. You may also schedule an earlier appointment if necessary.  Additionally, you may be receiving a survey about your experience at our office within a few days to 1 week by e-mail or mail. We value your feedback.  Nobie Putnam, DO [place]

## 2019-02-18 NOTE — Progress Notes (Signed)
Subjective:    Patient ID: Nicholas Weber, male    DOB: [birth year], 58 y.o.   MRN: [id number]  Nicholas Weber is a 58 y.o. male presenting on 02/18/2019 for Annual Exam   HPI  Here for Annual Physical and Lab Review.  Lifestyle / Wellness Reports no new concerns today - Last result A1c stable at 5.5 but still in normal range - Diet: improved now, more balanced, not following low carb diet - Fam history of DM - Drinks glass of wine nightly and some extra on weekends - He is improving regular walking/running exercise - often running/jogging exercise  Mild Normocytic Anemia - RESOLVED  Low WBC / Neutropenia Now lab CBC shows resolved Hgb anemia. Currently with mild low WBC 2.9 and ANC low. He has not had any symptoms from this or recurrent infections. Prior WBC nearly low at 4. Unsure if any other genetic factor for this.   Health Maintenance:  Due for Flu Shot, will receive today   Colon CA Screening: Last Colonoscopy (1st) 2014 approx [facility] results with no polyps, good for 10 years. Currently asymptomatic. No known family history of colon CA. Never received report - will offer Cologuard now, he will consider it 1-2 years.  Prostate CA Screening: No prior prostate CA screening, Prior DRE reported normal. Last PSA 0.4 (01/2019). Currently asymptomatic. No known family history of prostate CA but possibly may have paternal uncle in age 3260s with prostate problem   [facility] 02/18/2019 09/27/2018 06/16/2018  Decreased Interest 0 0 0  Down, Depressed, Hopeless 0 0 0  PHQ - 2 Score 0 0 0    Past Medical History:  Diagnosis Date  . Bleeding external hemorrhoids 11/25/2016  . Constipation 11/25/2016   Past Surgical History:  Procedure Laterality Date  . COLONOSCOPY  2016   Social History   Socioeconomic History  . Marital status: Married    Spouse name: Not on file  . Number  of children: Not on file  . Years of education: Not on file  . Highest education level: Not on file  Occupational History  . Not on file  Social Needs  . Financial resource strain: Not on file  . Food insecurity    Worry: Not on file    Inability: Not on file  . Transportation needs    Medical: Not on file    Non-medical: Not on file  Tobacco Use  . Smoking status: Never Smoker  . Smokeless tobacco: Never Used  Substance and Sexual Activity  . Alcohol use: Yes  . Drug use: No  . Sexual activity: Not on file  Lifestyle  . Physical activity    Days per week: Not on file    Minutes per session: Not on file  . Stress: Not on file  Relationships  . Social Musicianconnections    Talks on phone: Not on file    Gets together: Not on file    Attends religious service: Not on file    Active member of club or organization: Not on file    Attends meetings of clubs or organizations: Not on file    Relationship status: Not on file  . Intimate partner violence    Fear of current or ex partner: Not on file    Emotionally abused: Not on file    Physically abused: Not on file    Forced sexual activity: Not on  file  Other Topics Concern  . Not on file  Social History Narrative  . Not on file   Family History  Problem Relation Age of Onset  . Hypertension Mother   . Diabetes Father   . Prostate cancer Paternal Uncle 48       unsure exactly if cancer  . Colon cancer Neg Hx    Current Outpatient Medications on File Prior to Visit  Medication Sig  . ciclopirox (LOPROX) 0.77 % cream Apply topically 2 (two) times daily. For up to 4 weeks  . meloxicam (MOBIC) 15 MG tablet Take 1 tablet (15 mg total) by mouth daily as needed for pain.  . Multiple Vitamin (MULTIVITAMIN) tablet Take 1 tablet by mouth daily.  Marland Kitchen triamcinolone cream (KENALOG) 0.1 % Apply 1 application topically 2 (two) times daily as needed. For up to 1-2 weeks   No current facility-administered medications on file prior to visit.      Review of Systems  Constitutional: Negative for activity change, appetite change, chills, diaphoresis, fatigue and fever.  HENT: Negative for congestion and hearing loss.   Eyes: Negative for visual disturbance.  Respiratory: Negative for apnea, cough, chest tightness, shortness of breath and wheezing.   Cardiovascular: Negative for chest pain, palpitations and leg swelling.  Gastrointestinal: Negative for abdominal pain, anal bleeding, blood in stool, constipation, diarrhea, nausea and vomiting.  Endocrine: Negative for cold intolerance.  Genitourinary: Negative for difficulty urinating, dysuria, frequency and hematuria.  Musculoskeletal: Negative for arthralgias, back pain and neck pain.  Skin: Negative for rash.  Allergic/Immunologic: Negative for environmental allergies.  Neurological: Negative for dizziness, weakness, light-headedness, numbness and headaches.  Hematological: Negative for adenopathy.  Psychiatric/Behavioral: Negative for behavioral problems, dysphoric mood and sleep disturbance. The patient is not nervous/anxious.    Per HPI unless specifically indicated above     Objective:    BP 127/77   Pulse (!) 59   Temp 98.4 F (36.9 C)   Resp 16   Ht 5\' 10"  (1.778 m)   Wt 185 lb (83.9 kg)   BMI 26.54 kg/m   Wt Readings from Last 3 Encounters:  02/18/19 185 lb (83.9 kg)  06/16/18 182 lb (82.6 kg)  05/23/18 182 lb (82.6 kg)    Physical Exam Vitals signs and nursing note reviewed.  Constitutional:      General: He is not in acute distress.    Appearance: He is well-developed. He is not diaphoretic.     Comments: Well-appearing, comfortable, cooperative  HENT:     Head: Normocephalic and atraumatic.  Eyes:     General:        Right eye: No discharge.        Left eye: No discharge.     Conjunctiva/sclera: Conjunctivae normal.     Pupils: Pupils are equal, round, and reactive to light.  Neck:     Musculoskeletal: Normal range of motion and neck supple.      Thyroid: No thyromegaly.     Comments: No carotid bruits Cardiovascular:     Rate and Rhythm: Normal rate and regular rhythm.     Heart sounds: Normal heart sounds. No murmur.  Pulmonary:     Effort: Pulmonary effort is normal. No respiratory distress.     Breath sounds: Normal breath sounds. No wheezing or rales.  Abdominal:     General: Bowel sounds are normal. There is no distension.     Palpations: Abdomen is soft. There is no mass.     Tenderness: There  is no abdominal tenderness.  Musculoskeletal: Normal range of motion.        General: No tenderness.     Comments: Upper / Lower Extremities: - Normal muscle tone, strength bilateral upper extremities 5/5, lower extremities 5/5  Lymphadenopathy:     Cervical: No cervical adenopathy.  Skin:    General: Skin is warm and dry.     Findings: No erythema or rash.  Neurological:     Mental Status: He is alert and oriented to person, place, and time.     Comments: Distal sensation intact to light touch all extremities  Psychiatric:        Behavior: Behavior normal.     Comments: Well groomed, good eye contact, normal speech and thoughts      Recent Labs    02/11/19 0801  HGBA1C 5.5   Lipid Panel     Component Value Date/Time   CHOL 162 02/11/2019 0801   TRIG 53 02/11/2019 0801   HDL 63 02/11/2019 0801   CHOLHDL 2.6 02/11/2019 0801   LDLCALC 86 02/11/2019 0801     Chemistry      Component Value Date/Time   NA 138 02/11/2019 0801   K 4.4 02/11/2019 0801   CL 104 02/11/2019 0801   CO2 28 02/11/2019 0801   BUN 17 02/11/2019 0801   CREATININE 0.97 02/11/2019 0801      Component Value Date/Time   CALCIUM 8.8 02/11/2019 0801   AST 18 02/11/2019 0801   ALT 12 02/11/2019 0801   BILITOT 0.7 02/11/2019 0801          Assessment & Plan:   Problem List Items Addressed This Visit    None    Visit Diagnoses    Annual physical exam    -  Primary   Needs flu shot       Relevant Orders   Flu Vaccine QUAD 6+ mos PF  IM (Fluarix Quad PF) (Completed)      Updated Health Maintenance information Flu shot today Reviewed recent lab results with patient Encouraged improvement to lifestyle with diet and exercise Maintain healthy weight  Uncertain cause of low WBC / consistent with a chronic neutropenia with prior reading mild low, no other cell lines abnormal prior mild anemia has resolved, normal PLT. No clinical concerns. Likely benign ethnic or genetic factor - Follow yearly, offered to check lab q 6 mo, declined.  No orders of the defined types were placed in this encounter.   Follow up plan: Return in about 1 year (around 02/18/2020) for Annual Physical.  Saralyn Pilar, DO [facility] 02/18/2019, 3:44 PM

## 2019-06-13 ENCOUNTER — Ambulatory Visit (INDEPENDENT_AMBULATORY_CARE_PROVIDER_SITE_OTHER): Payer: BC Managed Care – PPO

## 2019-06-13 ENCOUNTER — Ambulatory Visit
Admission: EM | Admit: 2019-06-13 | Discharge: 2019-06-13 | Disposition: A | Discharge status: Home / Self Care | Payer: BC Managed Care – PPO | Attending: Family Medicine | Admitting: Family Medicine

## 2019-06-13 ENCOUNTER — Other Ambulatory Visit: Payer: Self-pay

## 2019-06-13 DIAGNOSIS — S83412A Sprain of medial collateral ligament of left knee, initial encounter: Secondary | ICD-10-CM

## 2019-06-13 DIAGNOSIS — S8992XA Unspecified injury of left lower leg, initial encounter: Secondary | ICD-10-CM | POA: Diagnosis not present

## 2019-06-13 DIAGNOSIS — X500XXA Overexertion from strenuous movement or load, initial encounter: Secondary | ICD-10-CM | POA: Diagnosis not present

## 2019-06-13 DIAGNOSIS — M25562 Pain in left knee: Secondary | ICD-10-CM

## 2019-06-13 MED ORDER — MELOXICAM 15 MG PO TABS: 15.0000 mg | ORAL_TABLET | Freq: Every day | ORAL | 0 refills | Status: DC

## 2019-06-13 MED ORDER — HYDROCODONE-ACETAMINOPHEN 5-325 MG PO TABS: ORAL_TABLET | ORAL | 0 refills | Status: DC

## 2019-06-13 NOTE — ED Triage Notes (Signed)
Pt states "I think I hurt my leg at work." Left leg pain at inner knee. Started hurting on Saturday

## 2019-06-13 NOTE — Discharge Instructions (Addendum)
Ice, compression, elevation, over the counter tylenol as needed

## 2019-06-13 NOTE — ED Provider Notes (Signed)
[facility]    CSN: [id number] Arrival date & time: 06/13/19  0825      History   Chief Complaint Chief Complaint  Patient presents with  . Leg Pain    HPI Nicholas Weber is a 59 y.o. male.   59 yo male with a c/o left knee pain and slight swelling for the past 3 days. Denies any falls or other traumatic injury. States he had been doing some lifting and carrying of heavy boxes recently. Denies any fevers, redness.      Past Medical History:  Diagnosis Date  . Bleeding external hemorrhoids 11/25/2016  . Constipation 11/25/2016    Patient Active Problem List   Diagnosis Date Noted  . Neutropenia, unspecified ([place]) 02/18/2019  . Elevated serum creatinine 02/16/2018  . Bleeding external hemorrhoids 11/25/2016  . Constipation 11/25/2016    Past Surgical History:  Procedure Laterality Date  . COLONOSCOPY  2016       Home Medications    Prior to Admission medications   Medication Sig Start Date End Date Taking? Authorizing Provider  ciclopirox (LOPROX) 0.77 % cream Apply topically 2 (two) times daily. For up to 4 weeks 09/27/18   Olin Hauser, DO  HYDROcodone-acetaminophen (NORCO/VICODIN) 5-325 MG tablet 1-2 tab po qhs prn 06/13/19   Norval Gable, MD  meloxicam (MOBIC) 15 MG tablet Take 1 tablet (15 mg total) by mouth daily. 06/13/19   Norval Gable, MD  Multiple Vitamin (MULTIVITAMIN) tablet Take 1 tablet by mouth daily.    [provider]  triamcinolone cream (KENALOG) 0.1 % Apply 1 application topically 2 (two) times daily as needed. For up to 1-2 weeks 09/27/18   Olin Hauser, DO    Family History Family History  Problem Relation Age of Onset  . Hypertension Mother   . Diabetes Father   . Prostate cancer Paternal Uncle 68       unsure exactly if cancer  . Colon cancer Neg Hx     Social History Social History   Tobacco Use  . Smoking status: Never Smoker  . Smokeless tobacco: Never Used  Substance Use  Topics  . Alcohol use: Yes  . Drug use: No     Allergies   Patient has no known allergies.   Review of Systems Review of Systems   Physical Exam Triage Vital Signs ED Triage Vitals [06/13/19 0852]  Enc Vitals Group     BP 132/75     Pulse Rate 60     Resp 16     Temp 97.9 F (36.6 C)     Temp Source Oral     SpO2 100 %     Weight 182 lb (82.6 kg)     Height 5\' 10"  (1.778 m)     Head Circumference      Peak Flow      Pain Score 8     Pain Loc      Pain Edu?      Excl. in [place]?    No data found.  Updated Vital Signs BP 132/75 (BP Location: Right Arm)   Pulse 60   Temp 97.9 F (36.6 C) (Oral)   Resp 16   Ht 5\' 10"  (1.778 m)   Wt 82.6 kg   SpO2 100%   BMI 26.11 kg/m   Visual Acuity Right Eye Distance:   Left Eye Distance:   Bilateral Distance:    Right Eye Near:   Left Eye Near:    Bilateral  Near:     Physical Exam Vitals and nursing note reviewed.  Constitutional:      General: He is not in acute distress.    Appearance: He is not toxic-appearing or diaphoretic.  Musculoskeletal:     Left knee: Swelling and bony tenderness present. No deformity, effusion, erythema, ecchymosis, lacerations or crepitus. Normal range of motion. Tenderness present over the MCL. Normal alignment, normal meniscus and normal patellar mobility. Normal pulse.  Neurological:     Mental Status: He is alert.      UC Treatments / Results  Labs (all labs ordered are listed, but only abnormal results are displayed) Labs Reviewed - No data to display  EKG   Radiology DG Knee Complete 4 Views Left  Result Date: 06/13/2019 CLINICAL DATA:  Work injury, medial knee pain, initial encounter. EXAM: LEFT KNEE - COMPLETE 4+ VIEW COMPARISON:  None. FINDINGS: No joint effusion or fracture.  No degenerative changes. IMPRESSION: Negative. Electronically Signed   By: Leanna Battles M.D.   On: 06/13/2019 09:16    Procedures Procedures (including critical care time)  Medications  Ordered in UC Medications - No data to display  Initial Impression / Assessment and Plan / UC Course  I have reviewed the triage vital signs and the nursing notes.  Pertinent labs & imaging results that were available during my care of the patient were reviewed by me and considered in my medical decision making (see chart for details).      Final Clinical Impressions(s) / UC Diagnoses   Final diagnoses:  Sprain of medial collateral ligament of left knee, initial encounter     Discharge Instructions     Ice, compression, elevation, over the counter tylenol as needed    ED Prescriptions    Medication Sig Dispense Auth. Provider   meloxicam (MOBIC) 15 MG tablet Take 1 tablet (15 mg total) by mouth daily. 30 tablet Payton Mccallum, MD   HYDROcodone-acetaminophen (NORCO/VICODIN) 5-325 MG tablet 1-2 tab po qhs prn 8 tablet Wlliam Grosso, Pamala Hurry, MD      1. x-ray results and diagnosis reviewed with patient 2. rx as per orders above; reviewed possible side effects, interactions, risks and benefits  3. Recommend supportive treatment as above 4. Follow-up prn if symptoms worsen or don't improve   I have reviewed the PDMP during this encounter.   Payton Mccallum, MD 06/13/19 1339

## 2019-06-21 ENCOUNTER — Other Ambulatory Visit: Payer: Self-pay

## 2019-06-21 ENCOUNTER — Encounter: Payer: Self-pay | Admitting: Family Medicine

## 2019-06-21 ENCOUNTER — Ambulatory Visit (INDEPENDENT_AMBULATORY_CARE_PROVIDER_SITE_OTHER): Payer: BC Managed Care – PPO | Admitting: Family Medicine

## 2019-06-21 DIAGNOSIS — L2089 Other atopic dermatitis: Secondary | ICD-10-CM | POA: Diagnosis not present

## 2019-06-21 NOTE — Patient Instructions (Addendum)
The rash looks most consistent with eczema, this can flare up and get worse due to a variety of factors (excessive dry skin from bathing/showering, soaps, cold weather / indoor heaters, outdoor exposures).  Use the topical steroid cream (Triamcinolone) twice a day for up to 1 week, maximum duration of use per one flare is 10 to 14 days, then STOP using it and allow skin to recover. Caution with over-use may cause lightening of the skin.   Do NOT use on the face.  Tips to reduce Eczema Flares: For baths/showers, limit bathing to every other day if you can (max 1 x daily)  Use a gentle, unscented soap and lukewarm water (hot water is most irritating to skin) Never scrub skin with too much pressure, this causes more irritation. Pat skin dry, then leave it slightly damp. DO NOT scrub it dry. Apply steroid cream to skin and rub in all the way, wait 15 min, then apply a daily moisturizer (Vaseline, Eucerin, Aveeno). Continue daily moisturizer every day of the year (even after flare is resolved) - If you have eczema on hands or dry hands, recommend wearing any type of gloves overnight (cloth, fabric, or even nitrile/latex) to improve effect of topical moisturizer  If develops redness, honey colored crust oozing, drainage of pus, bleeding, or redness / swelling, pain, please return for re-evaluation, may have become infected after scratching.   Please schedule a Follow-up Appointment to: Return in about 2 weeks (around 07/05/2019), or if symptoms worsen or fail to improve, for eczema rash.  If you have any other questions or concerns, please feel free to call the office or send a message through MyChart. You may also schedule an earlier appointment if necessary.  Additionally, you may be receiving a survey about your experience at our office within a few days to 1 week by e-mail or mail. We value your feedback.  Saralyn Pilar, DO [facility], [place]

## 2019-06-21 NOTE — Progress Notes (Signed)
Virtual Visit via Telephone The purpose of this virtual visit is to provide medical care while limiting exposure to the novel coronavirus (COVID19) for both patient and office staff.  Consent was obtained for phone visit:  Yes.   Answered questions that patient had about telehealth interaction:  Yes.   I discussed the limitations, risks, security and privacy concerns of performing an evaluation and management service by telephone. I also discussed with the patient that there may be a patient responsible charge related to this service. The patient expressed understanding and agreed to proceed.  Patient Location: Home Provider Location: [facility])  ---------------------------------------------------------------------- Chief Complaint  Patient presents with  . Rash    onset couple of month neck area    S: Reviewed CMA documentation. I have called patient and gathered additional HPI as follows:  Rash, eczema Reports that symptoms started few month ago, seems to have dry flaking irritated skin on R side of neck for while, he had other issue tinea corporis was on Ciclopiroax back in 09/2018. He tried that on this rash without results. Has not used topical steroid. He has admitted to some itching with dry skin rash at times. - He tried Kersol ointment (foot medicine) without relief Denies redness spreading rash, ulceration, drainage or bleeding  Denies any high risk travel to areas of current concern for COVID19. Denies any known or suspected exposure to person with or possibly with COVID19.  Denies any fevers, chills, sweats, body ache, cough, shortness of breath, sinus pain or pressure, headache, abdominal pain, diarrhea  Past Medical History:  Diagnosis Date  . Bleeding external hemorrhoids 11/25/2016  . Constipation 11/25/2016   Social History   Tobacco Use  . Smoking status: Never Smoker  . Smokeless tobacco: Never Used  Substance Use Topics  . Alcohol  use: Yes  . Drug use: No    Current Outpatient Medications:  .  ciclopirox (LOPROX) 0.77 % cream, Apply topically 2 (two) times daily. For up to 4 weeks, Disp: 30 g, Rfl: 0 .  HYDROcodone-acetaminophen (NORCO/VICODIN) 5-325 MG tablet, 1-2 tab po qhs prn, Disp: 8 tablet, Rfl: 0 .  meloxicam (MOBIC) 15 MG tablet, Take 1 tablet (15 mg total) by mouth daily., Disp: 30 tablet, Rfl: 0 .  Multiple Vitamin (MULTIVITAMIN) tablet, Take 1 tablet by mouth daily., Disp: , Rfl:  .  triamcinolone cream (KENALOG) 0.1 %, Apply 1 application topically 2 (two) times daily as needed. For up to 1-2 weeks, Disp: 30 g, Rfl: 0  Depression screen [facility] 2/9 06/21/2019 02/18/2019 09/27/2018  Decreased Interest 0 0 0  Down, Depressed, Hopeless 0 0 0  PHQ - 2 Score 0 0 0    No flowsheet data found.  -------------------------------------------------------------------------- O: No physical exam performed due to remote telephone encounter.  Lab results reviewed.  Picture viewed via secure email.  No results found for this or any previous visit (from the past 2160 hour(s)).  -------------------------------------------------------------------------- A&P:  Problem List Items Addressed This Visit    None    Visit Diagnoses    Other atopic dermatitis    -  Primary     Consistent with mild atopic dermatitis, eczema. - Uncomplicated, no evidence of superinfection or extensive body coverage.  Plan: 1. Use existing rx that he has of Triamcinolone 0.1% (body only) cream topical BID up to 1-2 weeks then stop for significant worsening flares only. 2. Start daily moisturizer BID 3. Counseled on routine eczema prevention/skin care per AVS 4. Return criteria  No orders of the defined types were placed in this encounter.   Follow-up: - Return in 2-4 weeks as needed if not resolved  Patient verbalizes understanding with the above medical recommendations including the limitation of remote medical advice.  Specific  follow-up and call-back criteria were given for patient to follow-up or seek medical care more urgently if needed.   - Time spent in direct consultation with patient on phone: 8 minutes  Saralyn Pilar, DO [facility] 06/21/2019, 8:57 AM

## 2019-07-09 ENCOUNTER — Other Ambulatory Visit: Payer: Self-pay

## 2019-07-09 ENCOUNTER — Ambulatory Visit: Payer: BC Managed Care – PPO | Attending: Internal Medicine

## 2019-07-09 ENCOUNTER — Ambulatory Visit: Payer: BC Managed Care – PPO

## 2019-07-09 DIAGNOSIS — Z23 Encounter for immunization: Secondary | ICD-10-CM

## 2019-07-09 NOTE — Progress Notes (Signed)
   Covid-19 Vaccination Clinic  Name:  Nicholas Weber    MRN: [id number] DOB: 1961-04-15  07/09/2019  [name] was observed post Covid-19 immunization for 15 minutes without incident. He was provided with Vaccine Information Sheet and instruction to access the V-Safe system.   [name] was instructed to call 911 with any severe reactions post vaccine: Marland Kitchen Difficulty breathing  . Swelling of face and throat  . A fast heartbeat  . A bad rash all over body  . Dizziness and weakness   Immunizations Administered    Name Date Dose VIS Date Route   Pfizer COVID-19 Vaccine 07/09/2019  2:36 PM 0.3 mL 04/08/2019 Intramuscular   Manufacturer: [employer], [employer]   Lot: [id number]   NDC: [address]-4

## 2019-08-02 ENCOUNTER — Ambulatory Visit: Payer: BC Managed Care – PPO | Attending: Internal Medicine

## 2019-08-02 DIAGNOSIS — Z23 Encounter for immunization: Secondary | ICD-10-CM

## 2019-08-02 NOTE — Progress Notes (Signed)
   Covid-19 Vaccination Clinic  Name:  Nicholas Weber    MRN: [id number] DOB: 01/13/1961  08/02/2019  [name] was observed post Covid-19 immunization for 15 minutes without incident. He was provided with Vaccine Information Sheet and instruction to access the V-Safe system.   [name] was instructed to call 911 with any severe reactions post vaccine: Marland Kitchen Difficulty breathing  . Swelling of face and throat  . A fast heartbeat  . A bad rash all over body  . Dizziness and weakness   Immunizations Administered    Name Date Dose VIS Date Route   Pfizer COVID-19 Vaccine 08/02/2019  1:43 PM 0.3 mL 04/08/2019 Intramuscular   Manufacturer: [employer], [employer]   Lot: [id number]   NDC: [address]-8

## 2019-11-22 ENCOUNTER — Encounter: Payer: Self-pay | Admitting: Family Medicine

## 2020-03-02 ENCOUNTER — Ambulatory Visit: Payer: BC Managed Care – PPO

## 2020-05-01 DIAGNOSIS — Z03818 Encounter for observation for suspected exposure to other biological agents ruled out: Secondary | ICD-10-CM | POA: Diagnosis not present

## 2020-05-01 DIAGNOSIS — Z1152 Encounter for screening for COVID-19: Secondary | ICD-10-CM | POA: Diagnosis not present

## 2020-09-12 ENCOUNTER — Encounter: Payer: Self-pay | Admitting: Family Medicine

## 2020-09-12 ENCOUNTER — Ambulatory Visit (INDEPENDENT_AMBULATORY_CARE_PROVIDER_SITE_OTHER): Payer: BC Managed Care – PPO | Admitting: Family Medicine

## 2020-09-12 DIAGNOSIS — M542 Cervicalgia: Secondary | ICD-10-CM

## 2020-09-12 DIAGNOSIS — M62838 Other muscle spasm: Secondary | ICD-10-CM

## 2020-09-12 DIAGNOSIS — S134XXA Sprain of ligaments of cervical spine, initial encounter: Secondary | ICD-10-CM | POA: Diagnosis not present

## 2020-09-12 DIAGNOSIS — M25511 Pain in right shoulder: Secondary | ICD-10-CM

## 2020-09-12 MED ORDER — BACLOFEN 10 MG PO TABS: 5.0000 mg | ORAL_TABLET | Freq: Three times a day (TID) | ORAL | 1 refills | Status: DC | PRN

## 2020-09-12 MED ORDER — MELOXICAM 15 MG PO TABS: 15.0000 mg | ORAL_TABLET | Freq: Every day | ORAL | 1 refills | Status: DC | PRN

## 2020-09-12 NOTE — Patient Instructions (Addendum)
Thank you for coming to the office today.  Start taking Baclofen (Lioresal) 10mg  (muscle relaxant) - start with one pill at night as needed for next 1-3 nights (may make you drowsy, caution with driving) see how it affects you, then if tolerated increase to one pill 2 to 3 times a day or (every 8 hours as needed)  Refilled meloxicam as needed for pain  Recommend to start taking Tylenol Extra Strength 500mg  tabs - take 1 to 2 tabs per dose (max 1000mg ) every 6-8 hours for pain (take regularly, don't skip a dose for next 7 days), max 24 hour daily dose is 6 tablets or 3000mg . In the future you can repeat the same everyday Tylenol course for 1-2 weeks at a time.   Please schedule a Follow-up Appointment to: Return in about 1 week (around 09/19/2020), or if symptoms worsen or fail to improve, for Shoulder injury.  If you have any other questions or concerns, please feel free to call the office or send a message through MyChart. You may also schedule an earlier appointment if necessary.  Additionally, you may be receiving a survey about your experience at our office within a few days to 1 week by e-mail or mail. We value your feedback.  , DO [facility], [facility]  Range of Motion Shoulder Exercises  Pendulum Circles - Lean with your good arm against a counter or table for support [facility] forward with a wide stance (make sure your body is comfortable) - Your painful shoulder should hang down and feel "heavy" - Gently move your painful arm in small circles "clockwise" for several turns - Switch to "counterclockwise" for several turns - Early on keep circles narrow and move slowly - Later in rehab, move in larger circles and faster movement   Wall Crawl - Stand close (about 1-2 ft away) to a wall, facing it directly - Reach out with your arm of painful shoulder and place fingers (not palm) on wall - You should make contact with wall at your waist level - Slowly  walk your fingers up the wall. Stay in contact with wall entire time, do not remove fingers - Keep walking fingers up wall until you reach shoulder level - You may feel tightening or mild discomfort, once you reach a height that causes pain or if you are already above your shoulder height then stop. Repeat from starting position. - Early on stand closer to wall, move fingers slowly, and stay at or below shoulder level - Later in rehab, stand farther away from wall (fingertips), move fingers quicker, go above shoulder level
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
## 2020-10-02 ENCOUNTER — Telehealth: Payer: Self-pay | Admitting: Family Medicine

## 2020-10-02 DIAGNOSIS — S134XXD Sprain of ligaments of cervical spine, subsequent encounter: Secondary | ICD-10-CM

## 2020-10-02 DIAGNOSIS — M542 Cervicalgia: Secondary | ICD-10-CM

## 2020-10-02 DIAGNOSIS — M25511 Pain in right shoulder: Secondary | ICD-10-CM

## 2020-10-02 NOTE — Telephone Encounter (Signed)
Copied from CRM [id number]. Topic: Referral - Request for Referral >> Oct 02, 2020  2:05 PM Jaquita Rector A wrote: Has patient seen PCP for this complaint? Yes.   *If NO, is insurance requiring patient see PCP for this issue before PCP can refer them? Referral for which specialty: MRI  Preferred provider/office: Radiology Reason for referral: Shoulder pains Would like to have the MRI before next week when his Chiropractor visits come to an end

## 2020-10-02 NOTE — Telephone Encounter (Signed)
He would like to proceed with a referral to Dr. Molli Hazard office. Thank you
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
## 2020-10-04 ENCOUNTER — Ambulatory Visit
Admission: RE | Admit: 2020-10-04 | Discharge: 2020-10-04 | Disposition: A | Discharge status: Home / Self Care | Payer: BC Managed Care – PPO | Source: Ambulatory Visit | Attending: Family Medicine | Admitting: Family Medicine

## 2020-10-04 ENCOUNTER — Other Ambulatory Visit: Payer: Self-pay

## 2020-10-04 ENCOUNTER — Encounter: Payer: Self-pay | Admitting: Family Medicine

## 2020-10-04 ENCOUNTER — Ambulatory Visit
Admission: RE | Admit: 2020-10-04 | Discharge: 2020-10-04 | Disposition: A | Discharge status: Home / Self Care | Payer: BC Managed Care – PPO | Attending: Family Medicine | Admitting: Family Medicine

## 2020-10-04 ENCOUNTER — Ambulatory Visit (INDEPENDENT_AMBULATORY_CARE_PROVIDER_SITE_OTHER): Payer: Self-pay | Admitting: Family Medicine

## 2020-10-04 VITALS — BP 126/74 | HR 60 | Temp 98.0°F | Ht 70.0 in | Wt 178.0 lb

## 2020-10-04 DIAGNOSIS — M7521 Bicipital tendinitis, right shoulder: Secondary | ICD-10-CM

## 2020-10-04 DIAGNOSIS — M24811 Other specific joint derangements of right shoulder, not elsewhere classified: Secondary | ICD-10-CM

## 2020-10-04 DIAGNOSIS — M25511 Pain in right shoulder: Secondary | ICD-10-CM | POA: Diagnosis not present

## 2020-10-04 DIAGNOSIS — M75101 Unspecified rotator cuff tear or rupture of right shoulder, not specified as traumatic: Secondary | ICD-10-CM
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
## 2020-10-04 NOTE — Assessment & Plan Note (Signed)
Positive O'Brien's, speeds, Yergason's testing raising concern for glenoid labrum/biceps tendon abnormality possibly in the setting of underlying glenohumeral osteoarthritis related arthralgia.  Initial evaluation with dedicated plain films, restart of meloxicam (the nature of this medication, purpose, appropriate dosing discussed at length with patient) for 2 weeks then as needed dosing.  Patient to start formal physical therapy as an adjunct to home-based exercise with a focus on shoulder stabilization with continued advance and range of motion.  Plan for follow-up in 4 weeks for clinical reevaluation.  If suboptimal response noted despite adherence to the above anticipate escalation in pharmacotherapy, local injection, advanced imaging as possible options.  If interval improvement noted, wean from medications, gradually transition to fully home-based maintenance rehab, and follow-up as clinically guided.  Patient is understanding and amenable to this plan.

## 2020-10-04 NOTE — Assessment & Plan Note (Addendum)
Positive O'Brien's, speeds, Yergason's testing raising concern for glenoid labrum/biceps tendon abnormality possibly in the setting of underlying glenohumeral osteoarthritis related arthralgia.  Initial evaluation with dedicated plain films, restart of meloxicam (the nature of this medication, purpose, appropriate dosing discussed at length with patient) for 2 weeks then as needed dosing.  Patient to start formal physical therapy as an adjunct to home-based exercise with a focus on shoulder stabilization with continued advance and range of motion.  Plan for follow-up in 4 weeks for clinical reevaluation.  If suboptimal response noted despite adherence to the above anticipate escalation in pharmacotherapy, local injection, advanced imaging to evaluate for tearing are possible options.  If interval improvement noted, wean from medications, gradually transition to fully home-based maintenance rehab, and follow-up as clinically guided.  Patient is understanding and amenable to this plan.

## 2020-10-04 NOTE — Patient Instructions (Addendum)
-   Restart meloxicam and dose consistently for 2 weeks (take with food), after 2 weeks can dose as needed - Start physical therapy with referral provided (call number listed below) - Okay to continue with chiropractor until follow-up - Obtain x-ray with order provided - Perform activity as tolerated using shoulder symptoms as a guide - Return for follow-up in 4 weeks  [facility]:  [place]:  [phone]

## 2020-10-04 NOTE — Assessment & Plan Note (Signed)
Examination reveals near full range of motion with pain during active extension and internal rotation with slight limitation when compared to contralateral, isolated supraspinatus testing elicits pain with preserved 5/5 strength, remainder and contralateral rotator cuff testing is benign.  Positive Neer's and Hawkins noted.  Concern for supraspinatus tendinopathy with impingement noted.  Initial evaluation with dedicated plain films, restart of meloxicam (the nature of this medication, purpose, appropriate dosing discussed at length with patient) for 2 weeks then as needed dosing.  Patient to start formal physical therapy as an adjunct to home-based exercise with a focus on shoulder stabilization with continued advance and range of motion.  Plan for follow-up in 4 weeks for clinical reevaluation.  If suboptimal response noted despite adherence to the above anticipate escalation in pharmacotherapy, local injection, advanced imaging as possible options.  If interval improvement noted, wean from medications, gradually transition to fully home-based maintenance rehab, and follow-up as clinically guided.  Patient is understanding and amenable to this plan.

## 2020-10-09 ENCOUNTER — Telehealth: Payer: Self-pay

## 2020-10-09 NOTE — Telephone Encounter (Signed)
Pt scheduled  

## 2020-10-09 NOTE — Telephone Encounter (Signed)
Please schedule patient around 7/7 for ortho follow-up per Dr. Ashley Royalty.

## 2020-10-09 NOTE — Telephone Encounter (Signed)
-----   Message from Jerrol Banana, MD sent at 10/09/2020  9:06 AM EDT ----- Schedule follow-up for patient around 7/7 timeframe. Thanks

## 2020-10-12 ENCOUNTER — Ambulatory Visit: Payer: Self-pay | Admitting: *Deleted

## 2020-10-12 NOTE — Telephone Encounter (Signed)
Reason for Disposition . [1] CLOSE CONTACT COVID-19 EXPOSURE within last 14 days AND [2] NO symptoms  Answer Assessment - Initial Assessment Questions 1. COVID-19 EXPOSURE: "Please describe how you were exposed to someone with a COVID-19 infection."     Family member has tested + COVID 2. PLACE of CONTACT: "Where were you when you were exposed to COVID-19?" (e.g., home, school, medical waiting room; which city?)     home 3. TYPE of CONTACT: "How much contact was there?" (e.g., sitting next to, live in same house, work in same office, same building)     Lives in same house 4. DURATION of CONTACT: "How long were you in contact with the COVID-19 patient?" (e.g., a few seconds, passed by person, a few minutes, 15 minutes or longer, live with the patient)     Lives in same house 5. MASK: "Were you wearing a mask?" "Was the other person wearing a mask?" Note: wearing a mask reduces the risk of an otherwise close contact.     no 6. DATE of CONTACT: "When did you have contact with a COVID-19 patient?" (e.g., how many days ago)     today 7. COMMUNITY SPREAD: "Are there lots of cases of COVID-19 (community spread) where you live?" (See public health department website, if unsure)       yes 8. SYMPTOMS: "Do you have any symptoms?" (e.g., fever, cough, breathing difficulty, loss of taste or smell)     no 9. VACCINE: "Have you gotten the COVID-19 vaccine?" If Yes, ask: "Which one, how many shots, when did you get it?"     yes 10. BOOSTER: "Have you received your COVID-19 booster?" If Yes, ask: "Which one and when did you get it?"       Yes- no second booster 11. PREGNANCY OR POSTPARTUM: "Is there any chance you are pregnant?" "When was your last menstrual period?" "Did you deliver in the last 2 weeks?"       N/a 12. HIGH RISK: "Do you have any heart or lung problems?" (e.g., asthma , COPD, heart failure) "Do you have a weak immune system or other risk factors?" (e.g., HIV positive, chemotherapy, renal  failure, diabetes mellitus, sickle cell anemia, obesity)       No 13. TRAVEL: "Have you traveled out of the country recently?" If Yes, ask: "When and where?"  Note: Travel becomes less relevant if there is widespread community transmission where the patient lives.       N/a  Protocols used: Coronavirus (COVID-19) Exposure-A-AH

## 2020-10-12 NOTE — Telephone Encounter (Signed)
I understand his concern. There is nothing however that we can do on our end to remove someone from their home due to covid to prevent spread to other family members. That is up to the person and the family to handle.  Yes precautions as listed below are appropriate. Different room, wearing masks, good ventilation, limited close contact.  I am not quite sure if there are other questions or concerns he has but this does not necessarily sound like a medical issue.  He can review updates on COVID Quarantine Protocol on [employer].  Saralyn Pilar, DO [facility] 10/12/2020, 1:31 PM

## 2020-10-12 NOTE — Telephone Encounter (Signed)
Patient is calling with concerns that his step daughter has tested + COVID twice- she is presently + COVID. Patient wants to now if there is anything he can have done medically to have her removed from the house- he feels that she is not being careful and considering the other members of the household.  Advised patient I do not think there is a medical reason to have her removed if she is a resident in the home. Recommend: Stay up to date with boosters for his own protection. Continue to have discussion and express concerns about exposure. Offer suggestions for safety- changing clothing and showering when coming home from work/outside activity, frequent cleaning, wearing mask at work.  Due to exposure- will need to test for COVID. Will pass concern to PCP.

## 2020-10-15 ENCOUNTER — Telehealth (INDEPENDENT_AMBULATORY_CARE_PROVIDER_SITE_OTHER): Payer: BC Managed Care – PPO | Admitting: Family Medicine

## 2020-10-15 ENCOUNTER — Other Ambulatory Visit: Payer: Self-pay

## 2020-10-15 ENCOUNTER — Telehealth: Payer: Self-pay

## 2020-10-15 VITALS — Wt 178.0 lb

## 2020-10-15 DIAGNOSIS — U071 COVID-19: Secondary | ICD-10-CM | POA: Diagnosis not present

## 2020-10-15 MED ORDER — MOLNUPIRAVIR EUA 200MG CAPSULE: 4.0000 | ORAL_CAPSULE | Freq: Two times a day (BID) | ORAL | 0 refills | Status: AC

## 2020-10-15 NOTE — Patient Instructions (Addendum)
Start covid medication Work note back on Friday   Please schedule a Follow-up Appointment to: Return in about 1 week (around 10/22/2020), or if symptoms worsen or fail to improve, for COVID19.  If you have any other questions or concerns, please feel free to call the office or send a message through MyChart. You may also schedule an earlier appointment if necessary.  Additionally, you may be receiving a survey about your experience at our office within a few days to 1 week by e-mail or mail. We value your feedback.  Saralyn Pilar, DO [facility], [place]

## 2020-10-15 NOTE — Telephone Encounter (Signed)
Pt was added for a virtual visit with Dr. Kirtland Bouchard to discuss his symptoms and get treatment.

## 2020-10-15 NOTE — Progress Notes (Signed)
Virtual Visit via Telephone The purpose of this virtual visit is to provide medical care while limiting exposure to the novel coronavirus (COVID19) for both patient and office staff.  Consent was obtained for phone visit:  Yes.   Answered questions that patient had about telehealth interaction:  Yes.   I discussed the limitations, risks, security and privacy concerns of performing an evaluation and management service by telephone. I also discussed with the patient that there may be a patient responsible charge related to this service. The patient expressed understanding and agreed to proceed.  Patient Location: Home Provider Location: [facility] (Office)  Participants in virtual visit: - Patient: Nicholas Weber - CMA: Burnell Blanks, CMA - Provider: Dr Althea Charon  ---------------------------------------------------------------------- Chief Complaint  Patient presents with   Covid Positive   Sore Throat   Cough    S: Reviewed CMA documentation. I have called patient and gathered additional HPI as follows:  COVID-19 Infection Reports that symptoms started with headache, fever, cough onset 10/13/20. Symptoms today have improved some. - Tried OTC - COVID19 Vaccine x2 then booster in 02/2021  Out of work since Sunday 10/13/20   Denies any fevers, chills, sweats, body ache, shortness of breath, sinus pain or pressure, abdominal pain, diarrhea  Past Medical History:  Diagnosis Date   Bleeding external hemorrhoids 11/25/2016   Constipation 11/25/2016   Social History   Tobacco Use   Smoking status: Never   Smokeless tobacco: Never  Vaping Use   Vaping Use: Never used  Substance Use Topics   Alcohol use: Yes   Drug use: No    Current Outpatient Medications:    baclofen (LIORESAL) 10 MG tablet, Take 0.5-1 tablets (5-10 mg total) by mouth 3 (three) times daily as needed for muscle spasms., Disp: 30 each, Rfl: 1   meloxicam (MOBIC) 15 MG tablet, Take 1  tablet (15 mg total) by mouth daily as needed for pain., Disp: 30 tablet, Rfl: 1   molnupiravir EUA 200 mg CAPS, Take 4 capsules (800 mg total) by mouth 2 (two) times daily for 5 days., Disp: 40 capsule, Rfl: 0   Multiple Vitamin (MULTIVITAMIN) tablet, Take 1 tablet by mouth daily., Disp: , Rfl:   Depression screen [facility] 2/9 10/04/2020 09/12/2020 06/21/2019  Decreased Interest 0 0 0  Down, Depressed, Hopeless 0 0 0  PHQ - 2 Score 0 0 0  Altered sleeping 0 0 -  Tired, decreased energy 0 0 -  Change in appetite 0 0 -  Feeling bad or failure about yourself  0 0 -  Trouble concentrating 0 0 -  Moving slowly or fidgety/restless 0 0 -  Suicidal thoughts 0 0 -  PHQ-9 Score 0 0 -  Difficult doing work/chores Not difficult at all Not difficult at all -    GAD 7 : Generalized Anxiety Score 10/04/2020 09/12/2020  Nervous, Anxious, on Edge 0 0  Control/stop worrying 0 0  Worry too much - different things 0 0  Trouble relaxing 0 0  Restless 0 0  Easily annoyed or irritable 0 0  Afraid - awful might happen 0 0  Total GAD 7 Score 0 0  Anxiety Difficulty Not difficult at all Not difficult at all    -------------------------------------------------------------------------- O: No physical exam performed due to remote telephone encounter.  Lab results reviewed.  No results found for this or any previous visit (from the past 2160 hour(s)).  -------------------------------------------------------------------------- A&P:  Problem List Items Addressed This Visit   None  Visit Diagnoses     COVID-19 virus infection    -  Primary   Relevant Medications   molnupiravir EUA 200 mg CAPS      COVID19 positive Symptom 1st onset 10/13/20 Confirm home test positive 10/13/20 Mild to moderate symptoms currently. No red flags or dyspnea Limited risk factors but age 4+   Start Molnpiravir 200mg  x 4 = 800mg  BID x 5 days, rx sent Counseling provided Reviewed benefits risks potential side effects Not order  Paxlovid due to no known GFR lab for Kidney Function in past 2 years.  Work note given out Sun-Thurs Back on Friday   Follow-up criteria given.   Meds ordered this encounter  Medications   molnupiravir EUA 200 mg CAPS    Sig: Take 4 capsules (800 mg total) by mouth 2 (two) times daily for 5 days.    Dispense:  40 capsule    Refill:  0    Follow-up: - Return in 1 week if unresolved COVID  Patient verbalizes understanding with the above medical recommendations including the limitation of remote medical advice.  Specific follow-up and call-back criteria were given for patient to follow-up or seek medical care more urgently if needed.   - Time spent in direct consultation with patient on phone: 12 minutes   , DO [facility] 10/15/2020, 4:24 PM

## 2020-10-15 NOTE — Telephone Encounter (Signed)
Copied from CRM [id number]. Topic: General - Other >> Oct 15, 2020  3:13 PM Aretta Nip wrote: Reason for CRM: copied CRM  Nicholas Weber (Patient) Nicholas Weber (Patient) General - Other Reason for CRM: Patient called in to inform Dr Kirtland Bouchard that he was diagnosed with Covid on 10/13/20 and need to know what to do. Say that his symptoms include a headache, fever, cough with little blood. Would like to know if there is something that can be done to help him. Asking for a call back at   Ph# [phone]   I spoke with him he is aware that I have to wait to hear back from the provider before advise is given.. It was hard to hear him because his voice is hoarse. He did confirm the symptoms but denied a virtual because of his voice.Marland Kitchen He would like something called in to help with the cough. Please advise

## 2020-10-16 ENCOUNTER — Ambulatory Visit: Payer: BC Managed Care – PPO

## 2020-10-18 ENCOUNTER — Ambulatory Visit: Payer: Self-pay | Admitting: *Deleted

## 2020-10-18 NOTE — Telephone Encounter (Signed)
Patient would like a call back to discuss Covid test results have some questions please call Ph#  [phone]   Call to patient- Patient was seen by PCP 6/20 and treated for + COVID with antiviral. Patient reports he is finished with recommended isolation and he is feeling much better. Patient is concerned that he is still testing + COVID and he has to have negative test to return to work. Advised patient of retesting protocol- and that he may test + COVID for up to 90 days. Patient advised he should speak to supervisor for next steps- but if he needs anything from the office please let PCP know.  How long should a person isolate after getting infected with COVID-19? Day 1 is the first full day after your symptoms developed or your positive test specimen was collected.  * NO SYMPTOMS BUT TESTED POSITIVE: Stay HOME (ISOLATE) FOR 5 DAYS. Wear a well-fitted MASK FOR 10 FULL DAYS any time you are around others inside your home or in public. Do not go to places where you are unable to wear a mask. Avoid travel for 10 days after your positive test.  * MILD-MODERATE COVID-19 ILLNESS: Stay HOME FOR 5 DAYS AND until fever is gone for at least 24 hours off fever-reducing medicines AND until any cough and other symptoms are improving. Wear a well-fitted MASK FOR 10 FULL DAYS any time you are around others inside your home or in public. Do not go to places where you are unable to wear a mask. Avoid travel for 10 days after your positive test.  * SEVERELY ILL WITH COVID-19: You should stay home for at least 10 days. Consult the doctor (or NP/PA) before ending isolation.  Reason for Disposition  General information question, no triage required and triager able to answer question  Answer Assessment - Initial Assessment Questions 1. REASON FOR CALL or QUESTION: "What is your reason for calling today?" or "How can I best help you?" or "What question do you have that I can help answer?"     Post COVID retesting for  work.  Protocols used: Information Only Call - No Triage-A-AH

## 2020-10-19 ENCOUNTER — Ambulatory Visit: Payer: Self-pay | Admitting: *Deleted

## 2020-10-19 NOTE — Telephone Encounter (Signed)
See triage notes.   I returned his call.  He is concerned that while on the antiviral (molnupiravir) for Covid that his urine is darker than usual and he is not urinating as often as he normally does even though he is drinking a lot of fluids.  Denies all other urinary symptoms when asked.  I have sent a note to [facility] to Dr. Althea Charon with his question.   I let him know someone would call him back.   He was agreeable to this plan.   He can be reached at [phone].

## 2020-10-19 NOTE — Telephone Encounter (Signed)
Reason for Disposition  All other urine symptoms    Taking antiviral for Covid.   His urine is darker than usual and he is not peeing as much as usual even though he is drinking a lot of fluids.  Is this normal while taking the antiviral?    Denies all  other urinary symptoms.  Answer Assessment - Initial Assessment Questions 1. SYMPTOM: "What's the main symptom you're concerned about?" (e.g., frequency, incontinence)     I'm on the antiviral and I'm not peeing as much as usual.   Positive for Covid.   My appetite is not back.   I'm drinking a lot of fluids.   2. ONSET: "When did the  urinating less than usual  start?"     Monday or Tuesday 3. PAIN: "Is there any pain?" If Yes, ask: "How bad is it?" (Scale: 1-10; mild, moderate, severe)     No burning but the urine is dark.    4. CAUSE: "What do you think is causing the symptoms?"     The antiviral for Covid.   Today is my last day on it.   My first dose was Monday.    I'm still testing positive for Covid.   I work at [employer] and they want a negative test.    I encouraged him to have his supervisor call the [facility]. 5. OTHER SYMPTOMS: "Do you have any other symptoms?" (e.g., fever, flank pain, blood in urine, pain with urination)     "I can tell a difference in my body since taking the antiviral".    "I feel better".  He denies burning with urination, feeling like he still needs to urinate after urinating, denies abd pain, or flank pain on either side.   "I'm just not peeing as much as usual and it's darker than usual".    "Is that from the pill for Covid?"   "Will my urine clear up and will I pee as much as usual after taking my last dose today?"  He mentioned he is testing every day for Covid and still testing positive.   I let him know he could possibly test positive for Covid up to 90 days.   I let him know after 10 days as long as he was fever free for 24 hrs without medication to keep the fever down and his symptoms were  resolving or gone he was no longer contagious and could return to his normal routine.   He mentioned he works for the [employer] and they want him to have a negative test.   "My wife keeps telling me what you are that I can test positive for 90 days and I need to tell them that".    I encouraged him to have his supervisor call the county health dept or look on the CDC web site for updated information regarding retesting.    "I will do that".    6. PREGNANCY: "Is there any chance you are pregnant?" "When was your last menstrual period?"     *No Answer*  Protocols used: Urinary Symptoms-A-AH

## 2020-10-23 ENCOUNTER — Ambulatory Visit: Payer: BC Managed Care – PPO | Attending: Family Medicine | Admitting: Physical Therapy

## 2020-10-23 ENCOUNTER — Other Ambulatory Visit: Payer: Self-pay

## 2020-10-23 DIAGNOSIS — M25511 Pain in right shoulder: Secondary | ICD-10-CM | POA: Diagnosis not present
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
## 2020-10-30 ENCOUNTER — Other Ambulatory Visit: Payer: Self-pay

## 2020-10-30 ENCOUNTER — Ambulatory Visit: Payer: BC Managed Care – PPO | Attending: Family Medicine

## 2020-10-30 DIAGNOSIS — M25511 Pain in right shoulder: Secondary | ICD-10-CM | POA: Diagnosis not present
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
## 2020-11-01 ENCOUNTER — Encounter: Payer: Self-pay | Admitting: Family Medicine

## 2020-11-01 ENCOUNTER — Other Ambulatory Visit: Payer: Self-pay

## 2020-11-01 ENCOUNTER — Ambulatory Visit: Payer: BC Managed Care – PPO | Admitting: Family Medicine

## 2020-11-01 VITALS — BP 104/72 | HR 75 | Temp 98.1°F | Ht 70.0 in | Wt 171.0 lb

## 2020-11-01 DIAGNOSIS — M542 Cervicalgia: Secondary | ICD-10-CM

## 2020-11-01 DIAGNOSIS — M75101 Unspecified rotator cuff tear or rupture of right shoulder, not specified as traumatic: Secondary | ICD-10-CM

## 2020-11-01 DIAGNOSIS — M7521 Bicipital tendinitis, right shoulder: Secondary | ICD-10-CM | POA: Diagnosis not present

## 2020-11-01 DIAGNOSIS — M25511 Pain in right shoulder: Secondary | ICD-10-CM | POA: Diagnosis not present

## 2020-11-01 DIAGNOSIS — M24811 Other specific joint derangements of right shoulder, not elsewhere classified: Secondary | ICD-10-CM

## 2020-11-01 MED ORDER — MELOXICAM 15 MG PO TABS: 15.0000 mg | ORAL_TABLET | Freq: Every day | ORAL | 1 refills | Status: DC | PRN

## 2020-11-01 NOTE — Assessment & Plan Note (Signed)
See additional assessments for appropriate plan notes

## 2020-11-01 NOTE — Patient Instructions (Addendum)
-   Finish out remaining sessions of physical therapy and transition to a fully home-based rehab maintenance program for the shoulders - Can continue anti-inflammatories on an as-needed basis for any shoulder pain - If shoulder pain recurs, despite the above measures, contact our office to discuss neck steps - Otherwise, contact for questions and follow-up as needed
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
## 2020-11-01 NOTE — Assessment & Plan Note (Signed)
See additional assessments for appropriate plan notes 
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
## 2020-11-05 ENCOUNTER — Telehealth: Payer: Self-pay | Admitting: Family Medicine

## 2020-11-05 ENCOUNTER — Other Ambulatory Visit: Payer: Self-pay | Admitting: Family Medicine

## 2020-11-05 DIAGNOSIS — M7521 Bicipital tendinitis, right shoulder: Secondary | ICD-10-CM

## 2020-11-05 DIAGNOSIS — M75101 Unspecified rotator cuff tear or rupture of right shoulder, not specified as traumatic: Secondary | ICD-10-CM

## 2020-11-05 DIAGNOSIS — M24811 Other specific joint derangements of right shoulder, not elsewhere classified: Secondary | ICD-10-CM

## 2020-11-05 NOTE — Telephone Encounter (Signed)
Pt seen dr Molli Hazard on 11/01/2020 for his right shoulder. Pt is unable to do push up and would like to know if dr Ashley Royalty would order MRI of right shoulder

## 2020-11-05 NOTE — Telephone Encounter (Signed)
Left message for patient to return call. MRI ordered per patient request, will need PA of MRI, and then someone will call the schedule him.  Will need to schedule to see Dr. Ashley Royalty 2-3 days after MRI done.  Will route this note to [facility] Nurse just in case the patient returns call to clinic.  [facility] Nurse may give patient message if patient returns the call.  CRM has also been created for this message.  Please attach any notes regarding this lab to this result message and do not create a new CRM.

## 2020-11-05 NOTE — Telephone Encounter (Signed)
Please advise.  Patient also following with PT and has an appointment tomorrow.

## 2020-11-07 ENCOUNTER — Other Ambulatory Visit: Payer: Self-pay

## 2020-11-07 ENCOUNTER — Ambulatory Visit: Payer: BC Managed Care – PPO

## 2020-11-07 ENCOUNTER — Telehealth: Payer: Self-pay

## 2020-11-07 DIAGNOSIS — M25511 Pain in right shoulder: Secondary | ICD-10-CM | POA: Diagnosis not present

## 2020-11-07 NOTE — Telephone Encounter (Signed)
Copied from CRM [id number]. Topic: General - Other >> Nov 07, 2020  2:37 PM [name], Nicholas Weber wrote: Reason for CRM: Pt was calling up to follow up on an order for an MRI, pt wanted to know how to schedule this. Please advise.

## 2020-11-07 NOTE — Telephone Encounter (Signed)
Sent my chart message responding to patient about MRI.
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
## 2020-11-13 ENCOUNTER — Other Ambulatory Visit: Payer: Self-pay

## 2020-11-13 ENCOUNTER — Ambulatory Visit: Payer: BC Managed Care – PPO

## 2020-11-13 DIAGNOSIS — M25511 Pain in right shoulder: Secondary | ICD-10-CM | POA: Diagnosis not present
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
## 2020-11-20 ENCOUNTER — Other Ambulatory Visit: Payer: Self-pay

## 2020-11-20 ENCOUNTER — Ambulatory Visit: Payer: BC Managed Care – PPO

## 2020-11-20 DIAGNOSIS — M25511 Pain in right shoulder: Secondary | ICD-10-CM | POA: Diagnosis not present
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
## 2020-11-20 NOTE — Patient Instructions (Signed)
Access Code: [email or web address] URL: [email or web address] Date: 11/20/2020 Prepared by: Ria Comment  Exercises Plank on Table with Scapular Protraction Retraction - 1 x daily - 7 x weekly - 2 sets - 10 reps Standing Shoulder Flexion to 90 Degrees with Dumbbells - 1 x daily - 7 x weekly - 2 sets - 10 reps - 3s hold Shoulder Abduction with Dumbbells - Thumbs Up - 1 x daily - 7 x weekly - 2 sets - 10 reps - 3s hold Shoulder Internal Rotation with Resistance - 1 x daily - 7 x weekly - 2 sets - 10 reps - 3s hold Shoulder External Rotation with Anchored Resistance - 1 x daily - 7 x weekly - 2 sets - 10 reps - 3s hold Prone Single Arm Shoulder Y - 1 x daily - 7 x weekly - 2 sets - 10 reps Shoulder W - External Rotation with Resistance - 1 x daily - 7 x weekly - 2 sets - 10 reps - 3s hold Standing Shoulder Posterior Capsule Stretch - 1 x daily - 7 x weekly - 2 reps - 30 hold Doorway Pec Stretch at 90 Degrees Abduction - 1 x daily - 7 x weekly - 2 reps - 30 hold

## 2020-11-22 ENCOUNTER — Ambulatory Visit
Admission: RE | Admit: 2020-11-22 | Discharge: 2020-11-22 | Disposition: A | Discharge status: Home / Self Care | Payer: BC Managed Care – PPO | Source: Ambulatory Visit | Attending: Family Medicine | Admitting: Family Medicine

## 2020-11-22 ENCOUNTER — Other Ambulatory Visit: Payer: Self-pay

## 2020-11-22 DIAGNOSIS — M24811 Other specific joint derangements of right shoulder, not elsewhere classified: Secondary | ICD-10-CM | POA: Diagnosis not present

## 2020-11-22 DIAGNOSIS — M75101 Unspecified rotator cuff tear or rupture of right shoulder, not specified as traumatic: Secondary | ICD-10-CM | POA: Insufficient documentation

## 2020-11-22 DIAGNOSIS — M19011 Primary osteoarthritis, right shoulder: Secondary | ICD-10-CM | POA: Diagnosis not present

## 2020-11-22 DIAGNOSIS — R600 Localized edema: Secondary | ICD-10-CM | POA: Diagnosis not present

## 2020-11-22 DIAGNOSIS — M7521 Bicipital tendinitis, right shoulder: Secondary | ICD-10-CM | POA: Insufficient documentation

## 2020-11-22 NOTE — Telephone Encounter (Signed)
Left voicemail to have patient set up follow up with Dr Ashley Royalty on MRI results.

## 2020-11-22 NOTE — Telephone Encounter (Signed)
Please schedule patient next week with Dr. Ashley Royalty to follow-up on MRI he is having done today, 11/22/20.

## 2020-11-23 NOTE — Progress Notes (Signed)
Nicholas Weber, the MRI does show primary involvement at the rotator cuff at one structure: the supraspinatus. There are some degenerative changes noted as well. The significance of these results is best determined by how you are doing.   If you could message back in regards to how the shoulder pain has been (if any), how your function has been, and for any general updates on the shoulder, that would help determine next steps.

## 2020-11-27 ENCOUNTER — Other Ambulatory Visit: Payer: Self-pay

## 2020-11-27 ENCOUNTER — Ambulatory Visit: Payer: BC Managed Care – PPO | Attending: Family Medicine

## 2020-11-27 DIAGNOSIS — M25511 Pain in right shoulder: Secondary | ICD-10-CM | POA: Diagnosis not present
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
## 2020-11-27 NOTE — Patient Instructions (Signed)
Access Code: [email or web address] URL: [email or web address] Date: 11/27/2020 Prepared by: Ria Comment  Exercises Plank on Table with Scapular Protraction Retraction - 1 x daily - 7 x weekly - 2 sets - 10 reps Standing Shoulder Flexion to 90 Degrees with Dumbbells - 1 x daily - 7 x weekly - 2 sets - 10 reps - 3s hold Shoulder Abduction with Dumbbells - Thumbs Up - 1 x daily - 7 x weekly - 2 sets - 10 reps - 3s hold Shoulder Internal Rotation with Resistance - 1 x daily - 7 x weekly - 2 sets - 10 reps - 3s hold Shoulder External Rotation with Anchored Resistance - 1 x daily - 7 x weekly - 2 sets - 10 reps - 3s hold Prone Single Arm Shoulder Y - 1 x daily - 7 x weekly - 2 sets - 10 reps Shoulder W - External Rotation with Resistance - 1 x daily - 7 x weekly - 2 sets - 10 reps - 3s hold Standing Shoulder Posterior Capsule Stretch - 1 x daily - 7 x weekly - 2 reps - 30 hold Doorway Pec Stretch at 90 Degrees Abduction - 1 x daily - 7 x weekly - 2 reps - 30 hold

## 2021-02-01 ENCOUNTER — Ambulatory Visit: Payer: BC Managed Care – PPO

## 2021-02-08 ENCOUNTER — Other Ambulatory Visit: Payer: Self-pay

## 2021-02-08 ENCOUNTER — Ambulatory Visit (INDEPENDENT_AMBULATORY_CARE_PROVIDER_SITE_OTHER): Payer: BC Managed Care – PPO

## 2021-02-08 DIAGNOSIS — Z23 Encounter for immunization: Secondary | ICD-10-CM

## 2021-02-11 ENCOUNTER — Ambulatory Visit (LOCAL_COMMUNITY_HEALTH_CENTER): Payer: Self-pay | Admitting: Nurse Practitioner

## 2021-02-11 ENCOUNTER — Other Ambulatory Visit: Payer: Self-pay

## 2021-02-11 DIAGNOSIS — Z111 Encounter for screening for respiratory tuberculosis: Secondary | ICD-10-CM

## 2021-02-14 ENCOUNTER — Other Ambulatory Visit: Payer: Self-pay

## 2021-02-14 ENCOUNTER — Ambulatory Visit (LOCAL_COMMUNITY_HEALTH_CENTER): Payer: BC Managed Care – PPO

## 2021-02-14 DIAGNOSIS — Z111 Encounter for screening for respiratory tuberculosis: Secondary | ICD-10-CM

## 2021-02-14 LAB — TB SKIN TEST
Induration: 0 mm
TB Skin Test: NEGATIVE

## 2021-05-20 ENCOUNTER — Ambulatory Visit: Payer: BC Managed Care – PPO | Admitting: Family Medicine

## 2021-06-07 ENCOUNTER — Ambulatory Visit: Payer: Self-pay

## 2021-06-07 NOTE — Telephone Encounter (Signed)
Reason for Disposition  [1] COVID-19 diagnosed by positive lab test (e.g., PCR, rapid self-test kit) AND [2] mild symptoms (e.g., cough, fever, others) AND [5] no complications or SOB  Answer Assessment - Initial Assessment Questions 1. COVID-19 DIAGNOSIS: "Who made your COVID-19 diagnosis?" "Was it confirmed by a positive lab test or self-test?" If not diagnosed by a doctor (or NP/PA), ask "Are there lots of cases (community spread) where you live?" Note: See public health department website, if unsure.     + COVID 2. COVID-19 EXPOSURE: "Was there any known exposure to COVID before the symptoms began?" CDC Definition of close contact: within 6 feet (2 meters) for a total of 15 minutes or more over a 24-hour period.        3. ONSET: "When did the COVID-19 symptoms start?"      Wednesday 4. WORST SYMPTOM: "What is your worst symptom?" (e.g., cough, fever, shortness of breath, muscle aches)     Lack of energy 5. COUGH: "Do you have a cough?" If Yes, ask: "How bad is the cough?"       Very little 6. FEVER: "Do you have a fever?" If Yes, ask: "What is your temperature, how was it measured, and when did it start?"     Not checked 7. RESPIRATORY STATUS: "Describe your breathing?" (e.g., shortness of breath, wheezing, unable to speak)      normal 8. BETTER-SAME-WORSE: "Are you getting better, staying the same or getting worse compared to yesterday?"  If getting worse, ask, "In what way?"     same 9. HIGH RISK DISEASE: "Do you have any chronic medical problems?" (e.g., asthma, heart or lung disease, weak immune system, obesity, etc.)     no 10. VACCINE: "Have you had the COVID-19 vaccine?" If Yes, ask: "Which one, how many shots, when did you get it?"       yes 11. BOOSTER: "Have you received your COVID-19 booster?" If Yes, ask: "Which one and when did you get it?"       Yes- not last booster 12. PREGNANCY: "Is there any chance you are pregnant?" "When was your last menstrual period?"       *No  Answer* 13. OTHER SYMPTOMS: "Do you have any other symptoms?"  (e.g., chills, fatigue, headache, loss of smell or taste, muscle pain, sore throat)       Fatigue, congestion, cough 14. O2 SATURATION MONITOR:  "Do you use an oxygen saturation monitor (pulse oximeter) at home?" If Yes, ask "What is your reading (oxygen level) today?" "What is your usual oxygen saturation reading?" (e.g., 95%)       *No Answer*  Protocols used: Coronavirus (COVID-19) Diagnosed or Suspected-A-AH

## 2021-06-07 NOTE — Telephone Encounter (Signed)
Summary: cold symptoms with tiredness   Patient called in for an appointment but nothing available with Dr Raliegh Ip. Has sinus congestion, cold symptoms, feel tired energy drained but does not think its Covid not having the same symptoms as he did last year when he had Covid. Concerned and ask for a call back at Ph# [phone]                Left message to call back about symptoms.

## 2021-06-07 NOTE — Telephone Encounter (Signed)
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 States he will home covid test and CB with results. States will need assist with [facility] if needed. Advised we will provide assistance. Pt wishes to test first. Placed in CB que    Reason for Disposition  Cough with cold symptoms (e.g., runny nose, postnasal drip, throat clearing)  Answer Assessment - Initial Assessment Questions 1. ONSET: "When did the cough begin?"      2-3 days ago 2. SEVERITY: "How bad is the cough today?"      Mild-moderate 3. SPUTUM: "Describe the color of your sputum" (none, dry cough; clear, white, yellow, green)     no 4. HEMOPTYSIS: "Are you coughing up any blood?" If so ask: "How much?" (flecks, streaks, tablespoons, etc.)     no 5. DIFFICULTY BREATHING: "Are you having difficulty breathing?" If Yes, ask: "How bad is it?" (e.g., mild, moderate, severe)    - MILD: No SOB at rest, mild SOB with walking, speaks normally in sentences, can lie down, no retractions, pulse < 100.    - MODERATE: SOB at rest, SOB with minimal exertion and prefers to sit, cannot lie down flat, speaks in phrases, mild retractions, audible wheezing, pulse 100-120.    - SEVERE: Very SOB at rest, speaks in single words, struggling to breathe, sitting hunched forward, retractions, pulse > 120      none 6. FEVER: "Do you have a fever?" If Yes, ask: "What is your temperature, how was it measured, and when did it start?"     Unsure 7. CARDIAC HISTORY: "Do you have any history of heart disease?" (e.g., heart attack, congestive heart failure)        8. LUNG HISTORY: "Do you have any history of lung disease?"  (e.g., pulmonary embolus, asthma, emphysema)      9. PE RISK FACTORS: "Do you have a history of blood clots?" (or: recent major surgery, recent prolonged travel, bedridden)      10. OTHER SYMPTOMS: "Do you have any other symptoms?" (e.g., runny nose, wheezing, chest pain)       Sinus stopped up, fatigued  Protocols used: Cough - Acute Non-Productive-A-AH

## 2021-06-07 NOTE — Telephone Encounter (Signed)
°  Chief Complaint: + COVID Symptoms: fatigue, slight cough/congestion Frequency: patient started symptoms Wednesday Pertinent Negatives: Patient denies SOB, fever Disposition: [] ED /[] Urgent Care (no appt availability in office) / [] Appointment(In office/virtual)/ []  [facility]/ [x] Home Care/ [] Refused Recommended Disposition /[] [facility]/ []  Follow-up with PCP Additional Notes: Patient + COVID, low risk, mild symptoms - advised COVID protocol: treatment/isolation, patient would like call back from provider since this is second COVID infection- does not want previous treatment

## 2021-06-07 NOTE — Telephone Encounter (Signed)
Left a voicemail to call back regarding his symptoms.

## 2021-09-18 ENCOUNTER — Ambulatory Visit: Payer: BC Managed Care – PPO | Admitting: Family Medicine

## 2021-09-18 ENCOUNTER — Encounter: Payer: Self-pay | Admitting: Family Medicine

## 2021-09-18 VITALS — BP 136/78 | HR 64 | Ht 70.0 in | Wt 184.2 lb

## 2021-09-18 DIAGNOSIS — Z91010 Allergy to peanuts: Secondary | ICD-10-CM

## 2021-09-18 DIAGNOSIS — L509 Urticaria, unspecified: Secondary | ICD-10-CM

## 2021-09-18 MED ORDER — TRIAMCINOLONE ACETONIDE 0.1 % EX CREA: 1.0000 "application " | TOPICAL_CREAM | Freq: Two times a day (BID) | CUTANEOUS | 3 refills | Status: DC

## 2021-09-18 NOTE — Patient Instructions (Addendum)
Thank you for coming to the office today.  Try Claritin or Loratadine 10mg  daily to prevent future rashes hives.  Switch detergent  Use topical cream if need spot treatment.  Please schedule a Follow-up Appointment to: Return if symptoms worsen or fail to improve.  If you have any other questions or concerns, please feel free to call the office or send a message through MyChart. You may also schedule an earlier appointment if necessary.  Additionally, you may be receiving a survey about your experience at our office within a few days to 1 week by e-mail or mail. We value your feedback.  , DO [facility], [facility]

## 2021-09-18 NOTE — Progress Notes (Signed)
Subjective:    Patient ID: Nicholas Weber, male    DOB: 09/06/1960, 61 y.o.   MRN: [id number]  Nicholas Weber is a 61 y.o. male presenting on 09/18/2021 for Rash   HPI  Urtcarial Hive Rash  He admits recently in past few months he noticed episodic or sporadic hive rash Says that he has been eating more peanuts and seemed to be the trigger. He stopped eating them for snack. Now resolved.  Now he admits night time he will get some similar hives. Usually on arms or back. Very sporadic. They resolve within few hours. No bug bites  Denies fever chills cough dyspnea wheezing allergy symptoms      09/18/2021    2:54 PM 11/01/2020    3:33 PM 10/04/2020    3:57 PM  Depression screen PHQ 2/9  Decreased Interest 1 0 0  Down, Depressed, Hopeless 0 0 0  PHQ - 2 Score 1 0 0  Altered sleeping 1 0 0  Tired, decreased energy 0 1 0  Change in appetite 0 0 0  Feeling bad or failure about yourself  0 0 0  Trouble concentrating 0 0 0  Moving slowly or fidgety/restless 0 0 0  Suicidal thoughts 0 0 0  PHQ-9 Score 2 1 0  Difficult doing work/chores Not difficult at all Not difficult at all Not difficult at all    Social History   Tobacco Use   Smoking status: Never   Smokeless tobacco: Never  Vaping Use   Vaping Use: Never used  Substance Use Topics   Alcohol use: Yes   Drug use: No    Review of Systems Per HPI unless specifically indicated above     Objective:    BP 136/78   Pulse 64   Ht 5\' 10"  (1.778 m)   Wt 184 lb 3.2 oz (83.6 kg)   SpO2 100%   BMI 26.43 kg/m   Wt Readings from Last 3 Encounters:  09/18/21 184 lb 3.2 oz (83.6 kg)  11/01/20 171 lb (77.6 kg)  10/15/20 178 lb (80.7 kg)    Physical Exam Vitals and nursing note reviewed.  Constitutional:      General: He is not in acute distress.    Appearance: Normal appearance. He is well-developed. He is not diaphoretic.     Comments: Well-appearing, comfortable, cooperative  HENT:     Head: Normocephalic and  atraumatic.  Eyes:     General:        Right eye: No discharge.        Left eye: No discharge.     Conjunctiva/sclera: Conjunctivae normal.  Cardiovascular:     Rate and Rhythm: Normal rate.  Pulmonary:     Effort: Pulmonary effort is normal.  Skin:    General: Skin is warm and dry.     Findings: No erythema or rash (no rash).  Neurological:     Mental Status: He is alert and oriented to person, place, and time.  Psychiatric:        Mood and Affect: Mood normal.        Behavior: Behavior normal.        Thought Content: Thought content normal.     Comments: Well groomed, good eye contact, normal speech and thoughts   Results for orders placed or performed in visit on 02/11/21  PPD  Result Value Ref Range   TB Skin Test Negative    Induration 0 mm      Assessment &  Plan:   Problem List Items Addressed This Visit   None Visit Diagnoses     Urticarial rash    -  Primary   Relevant Medications   triamcinolone cream (KENALOG) 0.1 %   Peanut allergy           Urticarial rash Peanut allergy initially to blame now that has resolved. Dietary avoidance. Not having anaphylaxis reaction. Mostly skin urticarial symptoms.  Now sporadic urticaria on extremities. No bug bites. Seems episodic infrequent, resolves completely within few hours.  Recommend topical triamcinolone PRN spot treatment  OTC Claritin regularly for prevention  Meds ordered this encounter  Medications   triamcinolone cream (KENALOG) 0.1 %    Sig: Apply 1 application. topically 2 (two) times daily. 1-2 weeks as needed.    Dispense:  15 g    Refill:  3     Follow up plan: Return if symptoms worsen or fail to improve.  Advised him to schedule Annual Physical and upcoming labs when ready.   Saralyn Pilar, DO [facility] 09/18/2021, 3:02 PM

## 2021-10-14 ENCOUNTER — Ambulatory Visit: Payer: Self-pay | Admitting: *Deleted

## 2021-10-14 ENCOUNTER — Ambulatory Visit (INDEPENDENT_AMBULATORY_CARE_PROVIDER_SITE_OTHER): Payer: BC Managed Care – PPO | Admitting: Physician Assistant

## 2021-10-14 ENCOUNTER — Encounter: Payer: Self-pay | Admitting: Physician Assistant

## 2021-10-14 VITALS — BP 125/73 | HR 61 | Ht 70.0 in | Wt 185.6 lb

## 2021-10-14 DIAGNOSIS — L509 Urticaria, unspecified: Secondary | ICD-10-CM

## 2021-10-14 DIAGNOSIS — M25472 Effusion, left ankle: Secondary | ICD-10-CM

## 2021-10-14 NOTE — Progress Notes (Signed)
Acute Office Visit   Patient: Nicholas Weber   DOB: 1960-07-24   61 y.o. Male  MRN: [id number] Visit Date: 10/14/2021  Today's healthcare provider: Oswaldo Conroy Vollie Aaron, PA-C  Introduced myself to the patient as a Secondary school teacher and provided education on APPs in clinical practice.    Chief Complaint  Patient presents with   Rash   Joint Swelling   Subjective    HPI    Rash on left ankle Started: about a week- resolving  Exposures: unsure, does not think he was bitten or exposed to poison ivy lately.  Interventions: Nothing   Left ankle swelling Denies pain or mobility issues Able to exercise  Interventions: None  Medications: Outpatient Medications Prior to Visit  Medication Sig   meloxicam (MOBIC) 15 MG tablet Take 1 tablet (15 mg total) by mouth daily as needed for pain.   Multiple Vitamin (MULTIVITAMIN) tablet Take 1 tablet by mouth daily.   triamcinolone cream (KENALOG) 0.1 % Apply 1 application. topically 2 (two) times daily. 1-2 weeks as needed.   baclofen (LIORESAL) 10 MG tablet Take 0.5-1 tablets (5-10 mg total) by mouth 3 (three) times daily as needed for muscle spasms. (Patient not taking: Reported on 11/01/2020)   No facility-administered medications prior to visit.    Review of Systems  Musculoskeletal:  Positive for joint swelling.  Skin:  Positive for rash.       Objective    BP 125/73   Pulse 61   Ht 5\' 10"  (1.778 m)   Wt 185 lb 9.6 oz (84.2 kg)   SpO2 100%   BMI 26.63 kg/m    Physical Exam Vitals reviewed.  Constitutional:      Appearance: Normal appearance.  HENT:     Head: Normocephalic and atraumatic.  Cardiovascular:     Pulses:          Dorsalis pedis pulses are 2+ on the right side and 2+ on the left side.  Musculoskeletal:     Right lower leg: No edema.     Left lower leg: No edema.     Right foot: Normal range of motion.     Left foot: Normal range of motion.  Feet:     Right foot:     Skin integrity: Callus and dry skin  present. No ulcer, blister or skin breakdown.     Toenail Condition: Right toenails are abnormally thick.     Left foot:     Skin integrity: Callus and dry skin present. No ulcer, blister or skin breakdown.     Toenail Condition: Left toenails are abnormally thick.     Comments: Left lateral ankle is normal in appearance.  Able to palpate anatomical structures with symmetry bilaterally.  Cap refill <2 seconds, no discernable edema Skin:    General: Skin is warm.     Capillary Refill: Capillary refill takes less than 2 seconds.     Findings: Erythema and rash present. Rash is not crusting, papular, scaling or vesicular.     Comments: Mild erythema along left lateral shin and ankle  No lesions, bumps, exudates or broken skin appreciated.   Neurological:     Mental Status: He is alert.       No results found for any visits on 10/14/21.  Assessment & Plan      No follow-ups on file.     Problem List Items Addressed This Visit   None Visit Diagnoses  Urticarial rash    -  Primary Acute, new problem Patient reports rash along left lateral ankle and shin that is resolving as of today's visit.  Unsure of inciting event or triggers, Reports leftover Triamcinolone cream provides relief from itching- can continue PRN Reviewed steps for maintaining skin barrier using moisturizers, Discouraged scratching  Follow up as needed for progressing or persistent symptoms.     Left ankle swelling     Acute concern Patient denies pain, reduced ROM or weakness. PE does not reveal noticeable swelling of left ankle compared to right- anatomical bony structures are intact and easily palpated. Skin is uninjured and intact. No evidence of rash extending over ankle at this time Recommend compression stockings with 15-25 mmHg compression to assist with mild daily LE swelling as needed  Reviewed maintaining skin barrier if swelling is severe.  Follow up as needed for persistent or progressing  symptoms          No follow-ups on file.   I, Keaton Beichner E Jaydy Fitzhenry, PA-C, have reviewed all documentation for this visit. The documentation on 10/14/21 for the exam, diagnosis, procedures, and orders are all accurate and complete.   Jacquelin Hawking, MHS, PA-C [facility]

## 2021-10-14 NOTE — Telephone Encounter (Signed)
sage from [facility] Piccolo sent at 10/14/2021  8:16 AM EDT  Summary: Left ankle swollen with no pain advice   Pt is calling to report that he has a rash with itching and swelling on the outside of the leg with no pain on the Left leg. Please advise           Call History   Type Contact Phone/Fax User  10/14/2021 08:14 AM EDT Phone (Incoming) Nicholas, Weber (Self) [phone] Rexene Edison) Jens Som A   Reason for Disposition  Localized rash present > 7 days  Answer Assessment - Initial Assessment Questions 1. APPEARANCE of RASH: "Describe the rash."      I have a rash on outside of my left leg.  2. LOCATION: "Where is the rash located?"      Outer ankle left side.    It's swollen my ankle.   I noticed the swelling yesterday but the rash has been there for about a week. 3. NUMBER: "How many spots are there?"      It looks like spots like being bitten by something and come together. 4. SIZE: "How big are the spots?" (Inches, centimeters or compare to size of a coin)      Small  5. ONSET: "When did the rash start?"      A week ago 6. ITCHING: "Does the rash itch?" If Yes, ask: "How bad is the itch?"  (Scale 0-10; or none, mild, moderate, severe)     Yes badly. Dr. Althea Charon prescribed a cream for itching a while back.  I used it and it helped.  It stops the itching but then it returns. 7. PAIN: "Does the rash hurt?" If Yes, ask: "How bad is the pain?"  (Scale 0-10; or none, mild, moderate, severe)    - NONE (0): no pain    - MILD (1-3): doesn't interfere with normal activities     - MODERATE (4-7): interferes with normal activities or awakens from sleep     - SEVERE (8-10): excruciating pain, unable to do any normal activities     No 8. OTHER SYMPTOMS: "Do you have any other symptoms?" (e.g., fever)     No 9. PREGNANCY: "Is there any chance you are pregnant?" "When was your last menstrual period?"     N/A  Protocols used: Rash or Redness - Localized-A-AH

## 2021-10-14 NOTE — Telephone Encounter (Signed)
  Chief Complaint: Left ankle with itchy rash and mild ankle swelling. Symptoms: Itching for a week,  noti   noticed mild swelling this morning. Frequency: Itching for the past week. Pertinent Negatives: Patient denies bleeding or drainage from spots, exposure to any known substances Disposition: [] ED /[] Urgent Care (no appt availability in office) / [x] Appointment(In office/virtual)/ []  [place] [facility]/ [] Home Care/ [] Refused Recommended Disposition /[] [facility]/ []  Follow-up with PCP Additional Notes: Appt made for today with Erin Mecum, PA-C at 10:00.

## 2021-10-14 NOTE — Patient Instructions (Signed)
To help with your swelling you can try to use compression stockings  I recommend getting some with 15-25 mmHg of compression force, I Recommend getting these at a medical supply store so they are appropriate. Put these on in the morning and wear throughout the day  To help with the rash and calluses on your feet I recommend using an ointment moisturizer such as Aquaphor or Vaseline  This will help maintain your skin barrier.  Use cotton socks and shoes that are an appropriate size with adequate support.

## 2022-02-10 ENCOUNTER — Ambulatory Visit (INDEPENDENT_AMBULATORY_CARE_PROVIDER_SITE_OTHER): Payer: BC Managed Care – PPO

## 2022-02-10 DIAGNOSIS — Z23 Encounter for immunization: Secondary | ICD-10-CM | POA: Diagnosis not present

## 2022-06-13 ENCOUNTER — Ambulatory Visit: Payer: Self-pay | Admitting: *Deleted

## 2022-06-13 ENCOUNTER — Encounter: Payer: Self-pay | Admitting: Family Medicine

## 2022-06-13 ENCOUNTER — Telehealth (INDEPENDENT_AMBULATORY_CARE_PROVIDER_SITE_OTHER): Payer: BC Managed Care – PPO | Admitting: Family Medicine

## 2022-06-13 VITALS — Ht 70.0 in | Wt 185.0 lb

## 2022-06-13 DIAGNOSIS — U071 COVID-19: Secondary | ICD-10-CM

## 2022-06-13 MED ORDER — MOLNUPIRAVIR EUA 200MG CAPSULE: 4.0000 | ORAL_CAPSULE | Freq: Two times a day (BID) | ORAL | 0 refills | Status: AC

## 2022-06-13 NOTE — Telephone Encounter (Signed)
  Chief Complaint: + COVID Symptoms: headache- slight dizziness, fatigue, sore throat Frequency: symptoms started yesterday Pertinent Negatives: Patient denies fever, SOB Disposition: []$ ED /[]$ Urgent Care (no appt availability in office) / [x]$ Appointment(In office/virtual)/ []$  [facility]/ []$ Home Care/ []$ Refused Recommended Disposition /[]$ [facility]/ []$  Follow-up with PCP Additional Notes: Patient is requesting visit with PCP to discuss treatment, COVID protocol reviewed- treatment, isolation- he is aware -he has had COVID in the past

## 2022-06-13 NOTE — Progress Notes (Signed)
Subjective:    Patient ID: Nicholas Weber, male    DOB: 10-22-1960, 62 y.o.   MRN: [id number]  Nicholas Weber is a 62 y.o. male presenting on 06/13/2022 for Covid Positive  Virtual / Telehealth Encounter - Video Visit via MyChart The purpose of this virtual visit is to provide medical care while limiting exposure to the novel coronavirus (COVID19) for both patient and office staff.  Consent was obtained for remote visit:  Yes.   Answered questions that patient had about telehealth interaction:  Yes.   I discussed the limitations, risks, security and privacy concerns of performing an evaluation and management service by video/telephone. I also discussed with the patient that there may be a patient responsible charge related to this service. The patient expressed understanding and agreed to proceed.  Patient Location: Home Provider Location: [facility] (Office)  Participants in virtual visit: - Patient: Nicholas Weber - CMA: Orinda Kenner, CMA - Provider: Dr Parks Ranger   HPI  COVID-19 Infection  Symptoms started 1-2 days with fever and headache, now worsening some today with cough, drainage, sore throat. He tried alka seltzer cold plus He has increased his fluid intake overall. 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 Needs work note. Admits some symptoms improved, still feels sinus drainage occasional cough. Denies any nausea vomiting body aches diarrhea abdominal pain dyspnea wheezing   Health Maintenance: History of prior COVID Vaccine     09/18/2021    2:54 PM 11/01/2020    3:33 PM 10/04/2020    3:57 PM  Depression screen PHQ 2/9  Decreased Interest 1 0 0  Down, Depressed, Hopeless 0 0 0  PHQ - 2 Score 1 0 0  Altered sleeping 1 0 0  Tired, decreased energy 0 1 0  Change in appetite 0 0 0  Feeling bad or failure about yourself  0 0 0  Trouble concentrating 0 0 0  Moving slowly or fidgety/restless 0 0 0  Suicidal thoughts 0 0 0   PHQ-9 Score 2 1 0  Difficult doing work/chores Not difficult at all Not difficult at all Not difficult at all    Social History   Tobacco Use   Smoking status: Never   Smokeless tobacco: Never  Vaping Use   Vaping Use: Never used  Substance Use Topics   Alcohol use: Yes   Drug use: No    Review of Systems Per HPI unless specifically indicated above     Objective:    Ht 5' 10"$  (1.778 m)   Wt 185 lb (83.9 kg)   BMI 26.54 kg/m   Wt Readings from Last 3 Encounters:  06/13/22 185 lb (83.9 kg)  10/14/21 185 lb 9.6 oz (84.2 kg)  09/18/21 184 lb 3.2 oz (83.6 kg)    Physical Exam  Note examination was completely remotely via video observation objective data only  Gen - well-appearing, no acute distress or apparent pain, comfortable HEENT - eyes appear clear without discharge or redness Heart/Lungs - cannot examine virtually - observed no evidence of coughing or labored breathing. Abd - cannot examine virtually  Skin - face visible today- no rash Neuro - awake, alert, oriented Psych - not anxious appearing   Results for orders placed or performed in visit on 02/11/21  PPD  Result Value Ref Range   TB Skin Test Negative    Induration 0 mm      Assessment & Plan:   Problem List Items Addressed This  Visit   None Visit Diagnoses     COVID-19 virus infection    -  Primary   Relevant Medications   molnupiravir EUA (LAGEVRIO) 200 mg CAPS capsule       COVID19 positive Symptom 1st onset 06/12/22 Confirm home test positive 06/13/22 Mild to moderate symptoms currently. No red flags or dyspnea Limited risk factors age 68  Start Molnpiravir 268m x 4 = 8028mBID x 5 days, rx sent Counseling provided. Reviewed benefits risks potential side effects Not order Paxlovid due to lack of chemistry GFR result Supportive care OTC PRN Follow-up criteria given.   Meds ordered this encounter  Medications   molnupiravir EUA (LAGEVRIO) 200 mg CAPS capsule    Sig: Take 4  capsules (800 mg total) by mouth 2 (two) times daily for 5 days.    Dispense:  40 capsule    Refill:  0      Follow up plan: Return if symptoms worsen or fail to improve, for COVID-19.  Patient verbalizes understanding with the above medical recommendations including the limitation of remote medical advice.  Specific follow-up and call-back criteria were given for patient to follow-up or seek medical care more urgently if needed.  Total duration of direct patient care provided via video conference: 10 minutes   AlNobie PutnamDOManorvilleroup 06/13/2022, 2:22 PM

## 2022-06-13 NOTE — Telephone Encounter (Signed)
Summary: positive for covid   Pt states that he tested positive for covid this morning, fever off and on and headaches. Pt would like to see if medication could be called in for him.     Reason for Disposition  [1] COVID-19 diagnosed by positive lab test (e.g., PCR, rapid self-test kit) AND [2] mild symptoms (e.g., cough, fever, others) AND [id number] no complications or SOB  Answer Assessment - Initial Assessment Questions 1. COVID-19 DIAGNOSIS: "How do you know that you have COVID?" (e.g., positive lab test or self-test, diagnosed by doctor or NP/PA, symptoms after exposure).     Home test today 2. COVID-19 EXPOSURE: "Was there any known exposure to COVID before the symptoms began?" CDC Definition of close contact: within 6 feet (2 meters) for a total of 15 minutes or more over a 24-hour period.      teacher 3. ONSET: "When did the COVID-19 symptoms start?"      yesterday 4. WORST SYMPTOM: "What is your worst symptom?" (e.g., cough, fever, shortness of breath, muscle aches)     Headache-"light" 5. COUGH: "Do you have a cough?" If Yes, ask: "How bad is the cough?"       no 6. FEVER: "Do you have a fever?" If Yes, ask: "What is your temperature, how was it measured, and when did it start?"     Not checked- probably 7. RESPIRATORY STATUS: "Describe your breathing?" (e.g., normal; shortness of breath, wheezing, unable to speak)      normal 8. BETTER-SAME-WORSE: "Are you getting better, staying the same or getting worse compared to yesterday?"  If getting worse, ask, "In what way?"     Worse- developing symptoms 9. OTHER SYMPTOMS: "Do you have any other symptoms?"  (e.g., chills, fatigue, headache, loss of smell or taste, muscle pain, sore throat)     Headache, dizzy, fatigue, sore throat 10. HIGH RISK DISEASE: "Do you have any chronic medical problems?" (e.g., asthma, heart or lung disease, weak immune system, obesity, etc.)       no  Protocols used: Coronavirus (COVID-19) Diagnosed or  Suspected-A-AH

## 2022-06-13 NOTE — Addendum Note (Signed)
Addended by: Olin Hauser on: 06/13/2022 02:34 PM   Modules accepted: Level of Service

## 2022-06-13 NOTE — Patient Instructions (Addendum)
Thank you for coming to the office today.  COVID-19 COVID-19, or coronavirus disease 2019, is an infection that is caused by a new (novel) coronavirus called SARS-CoV-2. COVID-19 can cause many symptoms. In some people, the virus may not cause any symptoms. In others, it may cause mild or severe symptoms. Some people with severe infection develop severe disease. What are the causes? This illness is caused by a virus. The virus may be in the air as tiny specks of fluid (aerosols) or droplets, or it may be on surfaces. You may catch the virus by: Breathing in droplets from an infected person. Droplets can be spread by a person breathing, speaking, singing, coughing, or sneezing. Touching something, like a table or a doorknob, that has virus on it (is contaminated) and then touching your mouth, nose, or eyes. What increases the risk? Risk for infection: You are more likely to get infected with the COVID-19 virus if: You are within 6 ft (1.8 m) of a person with COVID-19 for 15 minutes or longer. You are providing care for a person who is infected with COVID-19. You are in close personal contact with other people. Close personal contact includes hugging, kissing, or sharing eating or drinking utensils. Risk for serious illness caused by COVID-19: You are more likely to get seriously ill from the COVID-19 virus if: You have cancer. You have a long-term (chronic) disease, such as: Chronic lung disease. This includes pulmonary embolism, chronic obstructive pulmonary disease, and cystic fibrosis. Long-term disease that lowers your body's ability to fight infection (immunocompromise). Serious cardiac conditions, such as heart failure, coronary artery disease, or cardiomyopathy. Diabetes. Chronic kidney disease. Liver diseases. These include cirrhosis, nonalcoholic fatty liver disease, alcoholic liver disease, or autoimmune hepatitis. You have obesity. You are pregnant or were recently pregnant. You  have sickle cell disease. What are the signs or symptoms? Symptoms of this condition can range from mild to severe. Symptoms may appear any time from 2 to 14 days after being exposed to the virus. They include: Fever or chills. Shortness of breath or trouble breathing. Feeling tired or very tired. Headaches, body aches, or muscle aches. Runny or stuffy nose, sneezing, coughing, or sore throat. New loss of taste or smell. This is rare. Some people may also have stomach problems, such as nausea, vomiting, or diarrhea. Other people may not have any symptoms of COVID-19. How is this diagnosed? This condition may be diagnosed by testing samples to check for the COVID-19 virus. The most common tests are the PCR test and the antigen test. Tests may be done in the lab or at home. They include: Using a swab to take a sample of fluid from the back of your nose and throat (nasopharyngeal fluid), from your nose, or from your throat. Testing a sample of saliva from your mouth. Testing a sample of coughed-up mucus from your lungs (sputum). How is this treated? Treatment for COVID-19 infection depends on the severity of the condition. Mild symptoms can be managed at home with rest, fluids, and over-the-counter medicines. Serious symptoms may be treated in a hospital intensive care unit (ICU). Treatment in the ICU may include: Supplemental oxygen. Extra oxygen is given through a tube in the nose, a face mask, or a hood. Medicines. These may include: Antivirals, such as monoclonal antibodies. These help your body fight off certain viruses that can cause disease. Anti-inflammatories, such as corticosteroids. These reduce inflammation and suppress the immune system. Antithrombotics. These prevent or treat blood clots, if they develop.  Convalescent plasma. This helps boost your immune system, if you have an underlying immunosuppressive condition or are getting immunosuppressive treatments. Prone positioning.  This means you will lie on your stomach. This helps oxygen to get into your lungs. Infection control measures. If you are at risk for more serious illness caused by COVID-19, your health care provider may prescribe two long-acting monoclonal antibodies, given together every 6 months. How is this prevented? To protect yourself: Use preventive medicine (pre-exposure prophylaxis). You may get pre-exposure prophylaxis if you have moderate or severe immunocompromise. Get vaccinated. Anyone 17 months old or older who meets guidelines can get a COVID-19 vaccine or vaccine series. This includes people who are pregnant or making breast milk (lactating). Get an added dose of COVID-19 vaccine after your first vaccine or vaccine series if you have moderate to severe immunocompromise. This applies if you have had a solid organ transplant or have been diagnosed with an immunocompromising condition. You should get the added dose 4 weeks after you got the first COVID-19 vaccine or vaccine series. If you get an mRNA vaccine, you will need a 3-dose primary series. If you get the J&J/Janssen vaccine, you will need a 2-dose primary series, with the second dose being an mRNA vaccine. Talk to your health care provider about getting experimental monoclonal antibodies. This treatment is approved under emergency use authorization to prevent severe illness before or after being exposed to the COVID-19 virus. You may be given monoclonal antibodies if: You have moderate or severe immunocompromise. This includes treatments that lower your immune response. People with immunocompromise may not develop protection against COVID-19 when they are vaccinated. You cannot be vaccinated. You may not get a vaccine if you have a severe allergic reaction to the vaccine or its components. You are not fully vaccinated. You are in a facility where COVID-19 is present and: Are in close contact with a person who is infected with the COVID-19  virus. Are at high risk of being exposed to the COVID-19 virus. You are at risk of illness from new variants of the COVID-19 virus. To protect others: If you have symptoms of COVID-19, take steps to prevent the virus from spreading to others. Stay home. Leave your house only to get medical care. Do not use public transit, if possible. Do not travel while you are sick. Wash your hands often with soap and water for at least 20 seconds. If soap and water are not available, use alcohol-based hand sanitizer. Make sure that all people in your household wash their hands well and often. Cough or sneeze into a tissue or your sleeve or elbow. Do not cough or sneeze into your hand or into the air. Where to find more information Centers for Disease Control and Prevention: Charm[employer]be World Health Organization: https://www.castaneda.info/ Get help right away if: You have trouble breathing. You have pain or pressure in your chest. You are confused. You have bluish lips and fingernails. You have trouble waking from sleep. You have symptoms that get worse. These symptoms may be an emergency. Get help right away. Call 911. Do not wait to see if the symptoms will go away. Do not drive yourself to the hospital. Summary COVID-19 is an infection that is caused by a new coronavirus. Sometimes, there are no symptoms. Other times, symptoms range from mild to severe. Some people with a severe COVID-19 infection develop severe disease. The virus that causes COVID-19 can spread from person to person through droplets or aerosols from breathing, speaking, singing, coughing,  or sneezing. Mild symptoms of COVID-19 can be managed at home with rest, fluids, and over-the-counter medicines. This information is not intended to replace advice given to you by your health care provider. Make sure you discuss any questions you have with your health care provider. Document Revised: 04/02/2021 Document  Reviewed: 04/04/2021 Elsevier Patient Education  [place].   Please schedule a Follow-up Appointment to: No follow-ups on file.  If you have any other questions or concerns, please feel free to call the office or send a message through [place]. You may also schedule an earlier appointment if necessary.  Additionally, you may be receiving a survey about your experience at our office within a few days to 1 week by e-mail or mail. We value your feedback.  Nobie Putnam, DO [place]

## 2022-08-29 ENCOUNTER — Ambulatory Visit: Payer: BC Managed Care – PPO | Admitting: Internal Medicine

## 2022-08-29 ENCOUNTER — Encounter: Payer: Self-pay | Admitting: Internal Medicine

## 2022-08-29 VITALS — BP 132/66 | HR 68 | Temp 96.8°F | Wt 187.0 lb

## 2022-08-29 DIAGNOSIS — R519 Headache, unspecified: Secondary | ICD-10-CM

## 2022-08-29 NOTE — Patient Instructions (Signed)
Form - Headache Record There are many types and causes of headaches. A headache record can help guide your treatment plan. Use this form to record the details. Bring this form with you to your follow-up visits. Follow your health care provider's instructions on how to describe your headache. You may be asked to: Use a pain scale. This is a tool to rate the intensity of your headache using words or numbers. Describe what your headache feels like, such as dull, achy, throbbing, or sharp. Headache record Date: _______________ Time (from start to end): ____________________ Location of the headache: _________________________ Intensity of the headache: ____________________ Description of the headache: ______________________________________________________________ Hours of sleep the night before the headache: __________ Food or drinks before the headache started: ______________________________________________________________________________________ Events before the headache started: _______________________________________________________________________________________________ Symptoms before the headache started: __________________________________________________________________________________________ Symptoms during the headache: __________________________________________________________________________________________________ Treatment: ________________________________________________________________________________________________________________ Effect of treatment: _________________________________________________________________________________________________________ Other comments: ___________________________________________________________________________________________________________ Date: _______________ Time (from start to end): ____________________ Location of the headache: _________________________ Intensity of the headache: ____________________ Description of the headache:  ______________________________________________________________ Hours of sleep the night before the headache: __________ Food or drinks before the headache started: ______________________________________________________________________________________ Events before the headache started: ____________________________________________________________________________________________ Symptoms before the headache started: _________________________________________________________________________________________ Symptoms during the headache: _______________________________________________________________________________________________ Treatment: ________________________________________________________________________________________________________________ Effect of treatment: _________________________________________________________________________________________________________ Other comments: ___________________________________________________________________________________________________________ Date: _______________ Time (from start to end): ____________________ Location of the headache: _________________________ Intensity of the headache: ____________________ Description of the headache: ______________________________________________________________ Hours of sleep the night before the headache: __________ Food or drinks before the headache started: ______________________________________________________________________________________ Events before the headache started: ____________________________________________________________________________________________ Symptoms before the headache started: _________________________________________________________________________________________ Symptoms during the headache: _______________________________________________________________________________________________ Treatment:  ________________________________________________________________________________________________________________ Effect of treatment: _________________________________________________________________________________________________________ Other comments: ___________________________________________________________________________________________________________ Date: _______________ Time (from start to end): ____________________ Location of the headache: _________________________ Intensity of the headache: ____________________ Description of the headache: ______________________________________________________________ Hours of sleep the night before the headache: _________ Food or drinks before the headache started: ______________________________________________________________________________________ Events before the headache started: ____________________________________________________________________________________________ Symptoms before the headache started: _________________________________________________________________________________________ Symptoms during the headache: _______________________________________________________________________________________________ Treatment: ________________________________________________________________________________________________________________ Effect of treatment: _________________________________________________________________________________________________________ Other comments: ___________________________________________________________________________________________________________ Date: _______________ Time (from start to end): ____________________ Location of the headache: _________________________ Intensity of the headache: ____________________ Description of the headache: ______________________________________________________________ Hours of sleep the night before the headache: _________ Food or drinks before the headache started:  ______________________________________________________________________________________ Events before the headache started: ____________________________________________________________________________________________ Symptoms before the headache started: _________________________________________________________________________________________ Symptoms during the headache: _______________________________________________________________________________________________ Treatment: ________________________________________________________________________________________________________________ Effect of treatment: _________________________________________________________________________________________________________ Other comments: ___________________________________________________________________________________________________________ This information is not intended to replace advice given to you by your health care provider. Make sure you discuss any questions you have with your health care provider. Document Revised: 09/12/2020 Document Reviewed: 09/12/2020 Elsevier Patient Education  2023 Elsevier Inc.  

## 2022-08-29 NOTE — Progress Notes (Signed)
Subjective:    Patient ID: Nicholas Weber, male    DOB: 27-Nov-1960, 62 y.o.   MRN: [id number]  HPI  Patient presents to clinic today with complaint of intermittent headache.  This started about 6 months ago.  He is not sure what is triggering his headaches.  He describes the pain as dull and achy. He reports the headache only last for a few minutes and then resolves without intervention. He reports the headaches do not occur daily, can occur a few days per week, but typically occurs a few times per month.  He denies dizziness, vision changes, sensitivity to light and sound, nausea or vomiting. He denies any chronic neck pain that he feels is associated with these headaches.  He has not had an eye exam in the last few years.  He denies sinus or allergy symptoms. He has no family history of aneurysms.  Review of Systems     Past Medical History:  Diagnosis Date   Bleeding external hemorrhoids 11/25/2016   Constipation 11/25/2016    Current Outpatient Medications  Medication Sig Dispense Refill   baclofen (LIORESAL) 10 MG tablet Take 0.5-1 tablets (5-10 mg total) by mouth 3 (three) times daily as needed for muscle spasms. (Patient not taking: Reported on 06/13/2022) 30 each 1   meloxicam (MOBIC) 15 MG tablet Take 1 tablet (15 mg total) by mouth daily as needed for pain. 30 tablet 1   Multiple Vitamin (MULTIVITAMIN) tablet Take 1 tablet by mouth daily.     triamcinolone cream (KENALOG) 0.1 % Apply 1 application. topically 2 (two) times daily. 1-2 weeks as needed. 15 g 3   No current facility-administered medications for this visit.    No Known Allergies  Family History  Problem Relation Age of Onset   Hypertension Mother    Diabetes Father    Prostate cancer Paternal Uncle 68       unsure exactly if cancer   Colon cancer Neg Hx     Social History   Socioeconomic History   Marital status: Married    Spouse name: Deaton Bynes   Number of children: Not on file   Years of  education: Not on file   Highest education level: Not on file  Occupational History   Not on file  Tobacco Use   Smoking status: Never   Smokeless tobacco: Never  Vaping Use   Vaping Use: Never used  Substance and Sexual Activity   Alcohol use: Yes   Drug use: No   Sexual activity: Yes    Partners: Female  Other Topics Concern   Not on file  Social History Narrative   Not on file   Social Determinants of Health   Financial Resource Strain: Not on file  Food Insecurity: Not on file  Transportation Needs: Not on file  Physical Activity: Not on file  Stress: Not on file  Social Connections: Not on file  Intimate Partner Violence: Not on file     Constitutional: Patient reports headache.  Denies fever, malaise, fatigue, or abrupt weight changes.  HEENT: Denies eye pain, eye redness, ear pain, ringing in the ears, wax buildup, runny nose, nasal congestion, bloody nose, or sore throat. Respiratory: Denies difficulty breathing, shortness of breath, cough or sputum production.   Cardiovascular: Denies chest pain, chest tightness, palpitations or swelling in the hands or feet.  Musculoskeletal: Denies decrease in range of motion, difficulty with gait, muscle pain or joint pain and swelling.  Skin: Denies redness, rashes, lesions  or ulcercations.  Neurological: Denies dizziness, difficulty with memory, difficulty with speech or problems with balance and coordination.  Psych: Denies anxiety, depression, SI/HI.  No other specific complaints in a complete review of systems (except as listed in HPI above).  Objective:   Physical Exam BP 132/66 (BP Location: Right Arm, Patient Position: Sitting, Cuff Size: Normal)   Pulse 68   Temp (!) 96.8 F (36 C) (Temporal)   Wt 187 lb (84.8 kg)   SpO2 98%   BMI 26.83 kg/m   Wt Readings from Last 3 Encounters:  06/13/22 185 lb (83.9 kg)  10/14/21 185 lb 9.6 oz (84.2 kg)  09/18/21 184 lb 3.2 oz (83.6 kg)    General: Appears his stated  age, overweight, in NAD. Skin: Warm, dry and intact.  HEENT: Head: normal shape and size; Eyes: sclera white, no icterus, conjunctiva pink, PERRLA and EOMs intact; Ears: Tm's gray and intact, normal light reflex;  Cardiovascular: Normal rate and rhythm. S1,S2 noted.  No murmur, rubs or gallops noted.  Pulmonary/Chest: Normal effort and positive vesicular breath sounds. No respiratory distress. No wheezes, rales or ronchi noted.  Musculoskeletal: Normal flexion, extension, rotation of the cervical spine.  No difficulty with gait.  Neurological: Alert and oriented. Cranial nerves II-XII grossly intact. Coordination normal.      BMET    Component Value Date/Time   NA 138 02/11/2019 0801   K 4.4 02/11/2019 0801   CL 104 02/11/2019 0801   CO2 28 02/11/2019 0801   GLUCOSE 90 02/11/2019 0801   BUN 17 02/11/2019 0801   CREATININE 0.97 02/11/2019 0801   CALCIUM 8.8 02/11/2019 0801   GFRNONAA 86 02/11/2019 0801   GFRAA 100 02/11/2019 0801    Lipid Panel     Component Value Date/Time   CHOL 162 02/11/2019 0801   TRIG 53 02/11/2019 0801   HDL 63 02/11/2019 0801   CHOLHDL 2.6 02/11/2019 0801   LDLCALC 86 02/11/2019 0801    CBC    Component Value Date/Time   WBC 2.9 (L) 02/11/2019 0801   RBC 4.41 02/11/2019 0801   HGB 13.2 02/11/2019 0801   HCT 39.6 02/11/2019 0801   PLT 257 02/11/2019 0801   MCV 89.8 02/11/2019 0801   MCH 29.9 02/11/2019 0801   MCHC 33.3 02/11/2019 0801   RDW 13.4 02/11/2019 0801   LYMPHSABS 1,285 02/11/2019 0801   EOSABS 212 02/11/2019 0801   BASOSABS 70 02/11/2019 0801    Hgb A1C Lab Results  Component Value Date   HGBA1C 5.5 02/11/2019            Assessment & Plan:   Frequent Headaches:  Not currently having a headache today Does not appear to be blood pressure related Does not appear to be tension related or due to chronic neck pain Advised him to keep a headache diary so that we can try to identify and avoid triggers Advised him to  schedule an appointment for an eye exam to see if he needs his prescription updated Okay to take Tylenol or Excedrin Migraine OTC as needed  Follow-up with your PCP as previously scheduled [name], NP

## 2022-10-13 ENCOUNTER — Ambulatory Visit (INDEPENDENT_AMBULATORY_CARE_PROVIDER_SITE_OTHER): Payer: BC Managed Care – PPO | Admitting: Family Medicine

## 2022-10-13 ENCOUNTER — Encounter: Payer: Self-pay | Admitting: Family Medicine

## 2022-10-13 VITALS — BP 110/62 | HR 67 | Ht 70.0 in | Wt 183.0 lb

## 2022-10-13 DIAGNOSIS — Z23 Encounter for immunization: Secondary | ICD-10-CM

## 2022-10-13 DIAGNOSIS — Z0000 Encounter for general adult medical examination without abnormal findings: Secondary | ICD-10-CM

## 2022-10-13 DIAGNOSIS — R7309 Other abnormal glucose: Secondary | ICD-10-CM

## 2022-10-13 DIAGNOSIS — Z13220 Encounter for screening for lipoid disorders: Secondary | ICD-10-CM | POA: Diagnosis not present

## 2022-10-13 DIAGNOSIS — R7989 Other specified abnormal findings of blood chemistry: Secondary | ICD-10-CM | POA: Diagnosis not present

## 2022-10-13 DIAGNOSIS — Z125 Encounter for screening for malignant neoplasm of prostate: Secondary | ICD-10-CM

## 2022-10-13 MED ORDER — SHINGRIX 50 MCG/0.5ML IM SUSR: INTRAMUSCULAR | 1 refills | Status: DC

## 2022-10-13 NOTE — Progress Notes (Unsigned)
Subjective:    Patient ID: Nicholas Weber, male    DOB: 1960-08-27, 62 y.o.   MRN: [id number]  Nicholas Weber is a 62 y.o. male presenting on 10/13/2022 for Annual Exam   HPI  Here for Annual Physical and Lab Review.   Lifestyle / Wellness Reports no new concerns today - Last result A1c stable at 5.5 but still in normal range - Diet: improved now, more balanced, not following low carb diet - Fam history of DM - Drinks glass of wine nightly and some extra on weekends - He is improving regular walking/running exercise - often running/jogging exercise   Mild Normocytic Anemia - RESOLVED   Low WBC / Neutropenia Now lab CBC shows resolved Hgb anemia. Currently with mild low WBC 2.9 and ANC low. He has not had any symptoms from this or recurrent infections. Prior WBC nearly low at 4. Unsure if any other genetic factor for this.   ***Root canal last weds  Working out 2-3 x per week Goal to resume MVI If not taking metamucil or fiber, has issues with hemorrhoids    Health Maintenance:  Shingles    Colon CA Screening: Last Colonoscopy (1st) 2014 approx (done by [facility] near [facility], think it was [employer]), results with no polyps, good for 10 years. Currently asymptomatic. No known family history of colon CA. Never received report - will offer Cologuard now, he will consider it 1-2 years.  ***Colonoscopy 2025   Prostate CA Screening: No prior prostate CA screening, Prior DRE reported normal. Last PSA 0.4 (01/2019). Currently asymptomatic. No known family history of prostate CA but possibly may have paternal uncle in age 53s with prostate problem        10/13/2022    8:28 AM 09/18/2021    2:54 PM 11/01/2020    3:33 PM  Depression screen PHQ 2/9  Decreased Interest 0 1 0  Down, Depressed, Hopeless 0 0 0  PHQ - 2 Score 0 1 0  Altered sleeping  1 0  Tired, decreased energy  0 1  Change in appetite  0 0  Feeling bad or failure about yourself   0 0   Trouble concentrating  0 0  Moving slowly or fidgety/restless  0 0  Suicidal thoughts  0 0  PHQ-9 Score  2 1  Difficult doing work/chores  Not difficult at all Not difficult at all    Past Medical History:  Diagnosis Date   Bleeding external hemorrhoids 11/25/2016   Constipation 11/25/2016   Past Surgical History:  Procedure Laterality Date   COLONOSCOPY  2016   WRIST GANGLION EXCISION Left 2012   Social History   Socioeconomic History   Marital status: Married    Spouse name: Dyllan Schleeter   Number of children: Not on file   Years of education: Not on file   Highest education level: Not on file  Occupational History   Not on file  Tobacco Use   Smoking status: Never   Smokeless tobacco: Never  Vaping Use   Vaping Use: Never used  Substance and Sexual Activity   Alcohol use: Yes   Drug use: No   Sexual activity: Yes    Partners: Female  Other Topics Concern   Not on file  Social History Narrative   Not on file   Social Determinants of Health   Financial Resource Strain: Not on file  Food Insecurity: Not on file  Transportation Needs: Not on file  Physical Activity: Not on file  Stress: Not on file  Social Connections: Not on file  Intimate Partner Violence: Not on file   Family History  Problem Relation Age of Onset   Hypertension Mother    Diabetes Father    Prostate cancer Paternal Uncle 32       unsure exactly if cancer   Colon cancer Neg Hx    Current Outpatient Medications on File Prior to Visit  Medication Sig   Multiple Vitamin (MULTIVITAMIN) tablet Take 1 tablet by mouth daily. (Patient not taking: Reported on 10/13/2022)   No current facility-administered medications on file prior to visit.    Review of Systems Per HPI unless specifically indicated above      Objective:    BP 110/62   Pulse 67   Ht 5\' 10"  (1.778 m)   Wt 183 lb (83 kg)   SpO2 99%   BMI 26.26 kg/m   Wt Readings from Last 3 Encounters:  10/13/22 183 lb (83 kg)   08/29/22 187 lb (84.8 kg)  06/13/22 185 lb (83.9 kg)    Physical Exam Results for orders placed or performed in visit on 02/11/21  PPD  Result Value Ref Range   TB Skin Test Negative    Induration 0 mm      Assessment & Plan:   Problem List Items Addressed This Visit     Elevated serum creatinine   Other Visit Diagnoses     Annual physical exam    -  Primary   Relevant Orders   COMPLETE METABOLIC PANEL WITH GFR   CBC with Differential/Platelet   Lipid panel   Hemoglobin A1c   PSA   Need for shingles vaccine       Relevant Medications   SHINGRIX injection   Screening cholesterol level       Relevant Orders   Lipid panel   Abnormal glucose       Relevant Orders   Hemoglobin A1c   Screening for prostate cancer       Relevant Orders   PSA       Updated Health Maintenance information Fasting labs ordered for Weds 8/19, pending Encouraged improvement to lifestyle with diet and exercise    Meds ordered this encounter  Medications   SHINGRIX injection    Sig: Inject 0.5 mL into muscle for shingles vaccine. Repeat dose in 2-6 months.    Dispense:  0.5 mL    Refill:  1      Follow up plan: Return in about 1 year (around 10/13/2023) for 1 year fasting lab only then 1 week later Annual Physical.  Saralyn Pilar, DO [facility] 10/13/2022, 8:39 AM

## 2022-10-13 NOTE — Patient Instructions (Addendum)
Thank you for coming to the office today.  Colonoscopy next year 2025.  Labs ordered  DUE for FASTING BLOOD WORK (no food or drink after midnight before the lab appointment, only water or coffee without cream/sugar on the morning of)  SCHEDULE "Lab Only" visit in the morning at the clinic for lab draw in 2 days Weds 8/19  - Make sure Lab Only appointment is at about 1 week before your next appointment, so that results will be available  For Lab Results, once available within 2-3 days of blood draw, you can can log in to MyChart online to view your results and a brief explanation. Also, we can discuss results at next follow-up visit.   Please schedule a Follow-up Appointment to: Return in about 1 year (around 10/13/2023) for 1 year fasting lab only then 1 week later Annual Physical.  If you have any other questions or concerns, please feel free to call the office or send a message through MyChart. You may also schedule an earlier appointment if necessary.  Additionally, you may be receiving a survey about your experience at our office within a few days to 1 week by e-mail or mail. We value your feedback.  Saralyn Pilar, DO [facility], [place]

## 2022-10-15 ENCOUNTER — Other Ambulatory Visit: Payer: BC Managed Care – PPO

## 2022-10-15 DIAGNOSIS — Z125 Encounter for screening for malignant neoplasm of prostate: Secondary | ICD-10-CM | POA: Diagnosis not present

## 2022-10-15 DIAGNOSIS — R7309 Other abnormal glucose: Secondary | ICD-10-CM | POA: Diagnosis not present

## 2022-10-15 DIAGNOSIS — Z0000 Encounter for general adult medical examination without abnormal findings: Secondary | ICD-10-CM | POA: Diagnosis not present

## 2022-10-15 DIAGNOSIS — Z13220 Encounter for screening for lipoid disorders: Secondary | ICD-10-CM | POA: Diagnosis not present

## 2022-10-15 LAB — CBC WITH DIFFERENTIAL/PLATELET
Hemoglobin: 10 g/dL — ABNORMAL LOW (ref 13.2–17.1)
Lymphs Abs: 1130 cells/uL (ref 850–3900)
MCH: 26.7 pg — ABNORMAL LOW (ref 27.0–33.0)
MCHC: 31.2 g/dL — ABNORMAL LOW (ref 32.0–36.0)
Monocytes Relative: 13.9 %
Neutro Abs: 1338 cells/uL — ABNORMAL LOW (ref 1500–7800)
Neutrophils Relative %: 41.8 %
RBC: 3.74 10*6/uL — ABNORMAL LOW (ref 4.20–5.80)
RDW: 15.1 % — ABNORMAL HIGH (ref 11.0–15.0)

## 2022-10-16 LAB — LIPID PANEL
Cholesterol: 171 mg/dL (ref <200)
HDL: 71 mg/dL (ref >=40)
LDL Cholesterol (Calc): 86 mg/dL (calc)
Non-HDL Cholesterol (Calc): 100 mg/dL (calc) (ref <130)
Total CHOL/HDL Ratio: 2.4 (calc) (ref <5.0)
Triglycerides: 54 mg/dL (ref <150)

## 2022-10-16 LAB — CBC WITH DIFFERENTIAL/PLATELET
Absolute Monocytes: 445 cells/uL (ref 200–950)
Basophils Absolute: 61 cells/uL (ref 0–200)
Basophils Relative: 1.9 %
Eosinophils Absolute: 227 cells/uL (ref 15–500)
Eosinophils Relative: 7.1 %
HCT: 32.1 % — ABNORMAL LOW (ref 38.5–50.0)
MCV: 85.8 fL (ref 80.0–100.0)
MPV: 9.6 fL (ref 7.5–12.5)
Platelets: 369 10*3/uL (ref 140–400)
Total Lymphocyte: 35.3 %
WBC: 3.2 10*3/uL — ABNORMAL LOW (ref 3.8–10.8)

## 2022-10-16 LAB — COMPLETE METABOLIC PANEL WITH GFR
AG Ratio: 1.4 (calc) (ref 1.0–2.5)
ALT: 9 U/L (ref 9–46)
AST: 15 U/L (ref 10–35)
Albumin: 4.1 g/dL (ref 3.6–5.1)
Alkaline phosphatase (APISO): 45 U/L (ref 35–144)
BUN: 13 mg/dL (ref 7–25)
CO2: 26 mmol/L (ref 20–32)
Calcium: 8.7 mg/dL (ref 8.6–10.3)
Chloride: 105 mmol/L (ref 98–110)
Creat: 1.08 mg/dL (ref 0.70–1.35)
Globulin: 2.9 g/dL (calc) (ref 1.9–3.7)
Glucose, Bld: 83 mg/dL (ref 65–99)
Potassium: 4.4 mmol/L (ref 3.5–5.3)
Sodium: 139 mmol/L (ref 135–146)
Total Bilirubin: 0.4 mg/dL (ref 0.2–1.2)
Total Protein: 7 g/dL (ref 6.1–8.1)
eGFR: 79 mL/min/{1.73_m2} (ref >=60)

## 2022-10-16 LAB — HEMOGLOBIN A1C
Hgb A1c MFr Bld: 5.8 % of total Hgb — ABNORMAL HIGH (ref <5.7)
Mean Plasma Glucose: 120 mg/dL
eAG (mmol/L): 6.6 mmol/L

## 2022-10-16 LAB — PSA: PSA: 1.16 ng/mL (ref <=4.00)

## 2022-11-07 ENCOUNTER — Ambulatory Visit (INDEPENDENT_AMBULATORY_CARE_PROVIDER_SITE_OTHER): Payer: BC Managed Care – PPO | Admitting: Internal Medicine

## 2022-11-07 ENCOUNTER — Ambulatory Visit: Payer: Self-pay

## 2022-11-07 ENCOUNTER — Encounter: Payer: Self-pay | Admitting: Internal Medicine

## 2022-11-07 VITALS — BP 122/74 | HR 59 | Temp 97.7°F | Resp 17 | Ht 70.0 in | Wt 187.2 lb

## 2022-11-07 DIAGNOSIS — G8929 Other chronic pain: Secondary | ICD-10-CM | POA: Diagnosis not present

## 2022-11-07 DIAGNOSIS — M25472 Effusion, left ankle: Secondary | ICD-10-CM | POA: Diagnosis not present

## 2022-11-07 DIAGNOSIS — M25562 Pain in left knee: Secondary | ICD-10-CM | POA: Diagnosis not present

## 2022-11-07 NOTE — Progress Notes (Signed)
Subjective:    Patient ID: Nicholas Weber, male    DOB: 10-14-1960, 62 y.o.   MRN: [id number]  HPI  Patient presents to clinic today with complaint of left knee and ankle pain.  He reports the left knee pain is a chronic issue.  He describes the pain as stiff and achy.  He had an x-ray in 2021 which was normal.  He noticed the ankle swelling yesterday after walking 1 mile on the track.  He does not notice any pain in the left ankle. He denies any injury. He did not elevate or ice his ankle. He did not take any medications for this.   Review of Systems  Past Medical History:  Diagnosis Date   Bleeding external hemorrhoids 11/25/2016   Constipation 11/25/2016    Current Outpatient Medications  Medication Sig Dispense Refill   Multiple Vitamin (MULTIVITAMIN) tablet Take 1 tablet by mouth daily. (Patient not taking: Reported on 10/13/2022)     SHINGRIX injection Inject 0.5 mL into muscle for shingles vaccine. Repeat dose in 2-6 months. 0.5 mL 1   No current facility-administered medications for this visit.    No Known Allergies  Family History  Problem Relation Age of Onset   Hypertension Mother    Diabetes Father    Prostate cancer Paternal Uncle 45       unsure exactly if cancer   Colon cancer Neg Hx     Social History   Socioeconomic History   Marital status: Married    Spouse name: Ignac Eckstein   Number of children: Not on file   Years of education: Not on file   Highest education level: Not on file  Occupational History   Not on file  Tobacco Use   Smoking status: Never   Smokeless tobacco: Never  Vaping Use   Vaping status: Never Used  Substance and Sexual Activity   Alcohol use: Yes   Drug use: No   Sexual activity: Yes    Partners: Female  Other Topics Concern   Not on file  Social History Narrative   Not on file   Social Determinants of Health   Financial Resource Strain: Not on file  Food Insecurity: Not on file  Transportation Needs: Not on  file  Physical Activity: Not on file  Stress: Not on file  Social Connections: Not on file  Intimate Partner Violence: Not on file     Constitutional: Denies fever, malaise, fatigue, headache or abrupt weight changes.  Respiratory: Denies difficulty breathing, shortness of breath, cough or sputum production.   Cardiovascular: Denies chest pain, chest tightness, palpitations or swelling in the hands or feet.  Musculoskeletal: Patient reports chronic left knee and ankle swelling.  Denies decrease in range of motion, difficulty with gait, muscle pain.  Skin: Denies redness, rashes, lesions or ulcercations.  Neurological: Denies numbness, tingling or weakness or problems with balance and coordination.  Psych: Denies anxiety, depression, SI/HI.  No other specific complaints in a complete review of systems (except as listed in HPI above).     Objective:   Physical Exam   BP 122/74 (BP Location: Right Arm, Patient Position: Sitting, Cuff Size: Normal)   Pulse (!) 59   Temp 97.7 F (36.5 C) (Oral)   Resp 17   Ht 5\' 10"  (1.778 m)   Wt 187 lb 3.2 oz (84.9 kg)   SpO2 100%   BMI 26.86 kg/m   Wt Readings from Last 3 Encounters:  10/13/22 183 lb (83 kg)  08/29/22 187 lb (84.8 kg)  06/13/22 185 lb (83.9 kg)    General: Appears his stated age, overweight, in NAD. Skin: Warm, dry and intact.  Cardiovascular: Bradycardic with normal rhythm. S1,S2 noted.  No murmur, rubs or gallops noted.  Pulmonary/Chest: Normal effort and positive vesicular breath sounds. No respiratory distress. No wheezes, rales or ronchi noted.  Musculoskeletal: Normal flexion and extension of the left knee. Normal flexion, extension and rotation of the left ankle. No joint enlargement noted.  No pain with palpation of the left knee of the left ankle.  Trace nonpitting swelling noted around the left ankle.  Strength 5/5 BLE.  No difficulty with gait.  Neurological: Alert and oriented. Cranial nerves II-XII grossly  intact. Coordination normal.      BMET    Component Value Date/Time   NA 139 10/15/2022 0818   K 4.4 10/15/2022 0818   CL 105 10/15/2022 0818   CO2 26 10/15/2022 0818   GLUCOSE 83 10/15/2022 0818   BUN 13 10/15/2022 0818   CREATININE 1.08 10/15/2022 0818   CALCIUM 8.7 10/15/2022 0818   GFRNONAA 86 02/11/2019 0801   GFRAA 100 02/11/2019 0801    Lipid Panel     Component Value Date/Time   CHOL 171 10/15/2022 0818   TRIG 54 10/15/2022 0818   HDL 71 10/15/2022 0818   CHOLHDL 2.4 10/15/2022 0818   LDLCALC 86 10/15/2022 0818    CBC    Component Value Date/Time   WBC 3.2 (L) 10/15/2022 0818   RBC 3.74 (L) 10/15/2022 0818   HGB 10.0 (L) 10/15/2022 0818   HCT 32.1 (L) 10/15/2022 0818   PLT 369 10/15/2022 0818   MCV 85.8 10/15/2022 0818   MCH 26.7 (L) 10/15/2022 0818   MCHC 31.2 (L) 10/15/2022 0818   RDW 15.1 (H) 10/15/2022 0818   LYMPHSABS 1,130 10/15/2022 0818   EOSABS 227 10/15/2022 0818   BASOSABS 61 10/15/2022 0818    Hgb A1C Lab Results  Component Value Date   HGBA1C 5.8 (H) 10/15/2022           Assessment & Plan:   Chronic left knee pain:  He reports it is not pain as it is stiffness No indication to repeat x-ray of the left knee at this time No redness, swelling, warmth, pain with palpation and it is not affecting his ability to walk or run  Left ankle swelling:  Very minimal edema At this point no indication for x-ray Recommend elevation If it becomes painful, could try ice and ibuprofen OTC  Follow up with yuor PCP as previously requested [name], NP

## 2022-11-07 NOTE — Telephone Encounter (Signed)
  Chief Complaint: Swollen left ankle knee pain Symptoms: above Frequency: Swollen ankle yesterday, knee pain ongoing Pertinent Negatives: Patient denies Fever, redness, pain, sob, chest pain Disposition: [] ED /[] Urgent Care (no appt availability in office) / [x] Appointment(In office/virtual)/ []  [facility]/ [] Home Care/ [] Refused Recommended Disposition /[]  Mobile Bus/ []  Follow-up with PCP Additional Notes: Pt states he is very active. After walking around the track yesterday he noticed that his left ankle is swollen. Knee pain is longstanding.   Summary: Swollen ankle   Left knee pain, and swollen ankle.  [phone]     Answer Assessment - Initial Assessment Questions 1. LOCATION: "Which ankle is swollen?" "Where is the swelling?"     left 2. ONSET: "When did the swelling start?"     yesterday 3. SWELLING: "How bad is the swelling?" Or, "How large is it?" (e.g., mild, moderate, severe; size of localized swelling)    - NONE: No joint swelling.   - LOCALIZED: Localized; small area of puffy or swollen skin (e.g., insect bite, skin irritation).   - MILD: Joint looks or feels mildly swollen or puffy.   - MODERATE: Swollen; interferes with normal activities (e.g., work or school); decreased range of movement; may be limping.   - SEVERE: Very swollen; can't move swollen joint at all; limping a lot or unable to walk.     mild 4. PAIN: "Is there any pain?" If Yes, ask: "How bad is it?" (Scale 1-10; or mild, moderate, severe)   - NONE (0): no pain.   - MILD (1-3): doesn't interfere with normal activities.    - MODERATE (4-7): interferes with normal activities (e.g., work or school) or awakens from sleep, limping.    - SEVERE (8-10): excruciating pain, unable to do any normal activities, unable to walk.      no 5. CAUSE: "What do you think caused the ankle swelling?"     knee 6. OTHER SYMPTOMS: "Do you have any other symptoms?" (e.g., fever, chest pain,  difficulty breathing, calf pain)     no  Protocols used: Ankle Swelling-A-AH

## 2022-11-07 NOTE — Telephone Encounter (Signed)
Patient called, left VM to return the call to the office to speak to the NT.   Summary: Swollen ankle   Left knee pain, and swollen ankle.  [phone]

## 2022-11-07 NOTE — Patient Instructions (Signed)
Edema  Edema is an abnormal buildup of fluids in the body tissues and under the skin. Swelling of the legs, feet, and ankles is a common symptom that becomes more likely as you get older. Swelling is also common in looser tissues, such as around the eyes. Pressing on the area may make a temporary dent in your skin (pitting edema). This fluid may also accumulate in your lungs (pulmonary edema). There are many possible causes of edema. Eating too much salt (sodium) and being on your feet or sitting for a long time can cause edema in your legs, feet, and ankles. Common causes of edema include: Certain medical conditions, such as heart failure, liver or kidney disease, and cancer. Weak leg blood vessels. An injury. Pregnancy. Medicines. Being obese. Low protein levels in the blood. Hot weather may make edema worse. Edema is usually painless. Your skin may look swollen or shiny. Follow these instructions at home: Medicines Take over-the-counter and prescription medicines only as told by your health care provider. Your health care provider may prescribe a medicine to help your body get rid of extra water (diuretic). Take this medicine if you are told to take it. Eating and drinking Eat a low-salt (low-sodium) diet to reduce fluid as told by your health care provider. Sometimes, eating less salt may reduce swelling. Depending on the cause of your swelling, you may need to limit how much fluid you drink (fluid restriction). General instructions Raise (elevate) the injured area above the level of your heart while you are sitting or lying down. Do not sit still or stand for long periods of time. Do not wear tight clothing. Do not wear garters on your upper legs. Exercise your legs to get your circulation going. This helps to move the fluid back into your blood vessels, and it may help the swelling go down. Wear compression stockings as told by your health care provider. These stockings help to prevent  blood clots and reduce swelling in your legs. It is important that these are the correct size. These stockings should be prescribed by your health care provider to prevent possible injuries. If elastic bandages or wraps are recommended, use them as told by your health care provider. Contact a health care provider if: Your edema does not get better with treatment. You have heart, liver, or kidney disease and have symptoms of edema. You have sudden and unexplained weight gain. Get help right away if: You develop shortness of breath or chest pain. You cannot breathe when you lie down. You develop pain, redness, or warmth in the swollen areas. You have heart, liver, or kidney disease and suddenly get edema. You have a fever and your symptoms suddenly get worse. These symptoms may be an emergency. Get help right away. Call 911. Do not wait to see if the symptoms will go away. Do not drive yourself to the hospital. Summary Edema is an abnormal buildup of fluids in the body tissues and under the skin. Eating too much salt (sodium)and being on your feet or sitting for a long time can cause edema in your legs, feet, and ankles. Raise (elevate) the injured area above the level of your heart while you are sitting or lying down. Follow your health care provider's instructions about diet and how much fluid you can drink. This information is not intended to replace advice given to you by your health care provider. Make sure you discuss any questions you have with your health care provider. Document Revised: 12/17/2020 Document   Reviewed: 12/17/2020 Elsevier Patient Education  2024 Elsevier Inc.  

## 2022-11-07 NOTE — Addendum Note (Signed)
Addended by: Lorre Munroe on: 11/07/2022 11:05 AM   Modules accepted: Level of Service

## 2023-02-23 ENCOUNTER — Ambulatory Visit: Payer: BLUE CROSS/BLUE SHIELD

## 2023-03-30 ENCOUNTER — Ambulatory Visit: Payer: Self-pay | Admitting: *Deleted

## 2023-03-30 NOTE — Telephone Encounter (Signed)
Message from Lennox Pippins sent at 03/30/2023  8:22 AM EST  Summary: pain in big toe   Patient called and states he is 62 and likes to walk and run and he has developed pain in the bottom of his foot, where foot connects to big toe. Patient states he is off of work on [employer] and would like to be seen today and no appointments available with PCP. Pain is not severe, no other symptoms.  Patient states this morning he was going to the track but figured he better see about his toe.  Patient states he does work at [employer] and stands a lot.  Patients callback #  [phone]          Call History  Contact Date/Time Type Contact Phone/Fax User  03/30/2023 08:21 AM EST Phone (Incoming) Nemanja, Epp (Self) [phone] Rexene Edison) Muck, Army Melia   Reason for Disposition  [1] MODERATE pain (e.g., interferes with normal activities, limping) AND [2] present > 3 days  Answer Assessment - Initial Assessment Questions 1. ONSET: "When did the pain start?"      It's the bottom of my foot where the big toe connects to my foot. It started this morning.   I do a lot of physical stuff.   I was going to the track this morning but this morning it was really sore. 2. LOCATION: "Where is the pain located?"   (e.g., around nail, entire toe, at foot joint)      It's the bottom of my foot where my big toe connects to my foot.   3. PAIN: "How bad is the pain?"    (Scale 1-10; or mild, moderate, severe)   -  MILD (1-3): doesn't interfere with normal activities    -  MODERATE (4-7): interferes with normal activities (e.g., work or school) or awakens from sleep, limping    -  SEVERE (8-10): excruciating pain, unable to do any normal activities, unable to walk     Moderate 4. APPEARANCE: "What does the toe look like?" (e.g., redness, swelling, bruising, pallor)     Sore     Swollen a little 5. CAUSE: "What do you think is causing the toe pain?"     I don't know 6. OTHER SYMPTOMS: "Do you have any other symptoms?"  (e.g., leg pain, rash, fever, numbness)     No 7. PREGNANCY: "Is there any chance you are pregnant?" "When was your last menstrual period?"     N/A  Protocols used: Toe Pain-A-AH

## 2023-03-30 NOTE — Telephone Encounter (Signed)
  Chief Complaint: Pain in bottom of right foot near big toe joint Symptoms: Sore and mildly swollen on bottom of his foot where big toe connects to his foot. Frequency: Woke up with it worse this morning but has been sore for a few days. Pertinent Negatives: Patient denies injuries or accidents.   No history of gout. Disposition: [] ED /[] Urgent Care (no appt availability in office) / [x] Appointment(In office/virtual)/ []  [facility]/ [] Home Care/ [] Refused Recommended Disposition /[] [facility]/ []  Follow-up with PCP Additional Notes: Appt made with Dr. Althea Charon for 04/13/2023 at 3:00 at pt's request.   Off work on Mondays.

## 2023-04-13 ENCOUNTER — Encounter: Payer: Self-pay | Admitting: Family Medicine

## 2023-04-13 ENCOUNTER — Ambulatory Visit: Payer: BC Managed Care – PPO | Admitting: Family Medicine

## 2023-04-13 ENCOUNTER — Ambulatory Visit
Admission: RE | Admit: 2023-04-13 | Discharge: 2023-04-13 | Disposition: A | Discharge status: Home / Self Care | Payer: BC Managed Care – PPO | Source: Ambulatory Visit | Attending: Family Medicine | Admitting: Family Medicine

## 2023-04-13 ENCOUNTER — Ambulatory Visit
Admission: RE | Admit: 2023-04-13 | Discharge: 2023-04-13 | Disposition: A | Discharge status: Home / Self Care | Payer: BC Managed Care – PPO | Attending: Family Medicine | Admitting: Family Medicine

## 2023-04-13 VITALS — BP 128/74 | HR 64 | Ht 70.0 in | Wt 190.0 lb

## 2023-04-13 DIAGNOSIS — G8929 Other chronic pain: Secondary | ICD-10-CM | POA: Insufficient documentation

## 2023-04-13 DIAGNOSIS — M79671 Pain in right foot: Secondary | ICD-10-CM | POA: Diagnosis not present

## 2023-04-13 DIAGNOSIS — Z23 Encounter for immunization: Secondary | ICD-10-CM

## 2023-04-13 DIAGNOSIS — M799 Soft tissue disorder, unspecified: Secondary | ICD-10-CM | POA: Diagnosis not present

## 2023-04-13 DIAGNOSIS — M2011 Hallux valgus (acquired), right foot: Secondary | ICD-10-CM | POA: Diagnosis not present

## 2023-04-13 MED ORDER — MELOXICAM 15 MG PO TABS
15.0000 mg | ORAL_TABLET | Freq: Every day | ORAL | 2 refills | Status: AC | PRN
Start: 2023-04-13 — End: ?

## 2023-04-13 NOTE — Patient Instructions (Addendum)
Thank you for coming to the office today.  Try the meloxicam 15mg  as needed for arthritis pain.  X-ray today  Stay tuned for results.  If not improving or keeps coming back, we can refer to Podiatry   Please schedule a Follow-up Appointment to: Return if symptoms worsen or fail to improve.  If you have any other questions or concerns, please feel free to call the office or send a message through MyChart. You may also schedule an earlier appointment if necessary.  Additionally, you may be receiving a survey about your experience at our office within a few days to 1 week by e-mail or mail. We value your feedback.  Saralyn Pilar, DO [facility], [place]

## 2023-04-13 NOTE — Progress Notes (Signed)
Subjective:    Patient ID: Nicholas Weber, male    DOB: 1961/03/26, 62 y.o.   MRN: [id number]  Nicholas Weber is a 62 y.o. male presenting on 04/13/2023 for Acute Visit (Right foot pain, soreness inside of nose)   HPI  Discussed the use of AI scribe software for clinical note transcription with the patient, who gave verbal consent to proceed.  History of Present Illness    R Forefoot Pain, episodic  He presents with intermittent right foot pain, specifically localized to the area of the big toe. The discomfort, which has been occurring sporadically over the past three months, is described as soreness that is particularly noticeable upon waking and during ambulation. The patient reports that the pain is not severe enough to hinder daily activities, including regular exercise routines such as jogging and gym workouts. However, two weeks prior to the consultation, the pain was significant enough to deter the patient from his usual track exercise. The discomfort reportedly subsided after two to three days of rest. The patient also noted that the affected foot appeared slightly swollen and inflamed during these episodes of pain. Despite the intermittent discomfort, the patient denies any numbness or tingling in the foot. The patient suspects the pain could be related to arthritis, but this has not been medically confirmed. The patient has previously taken Meloxicam for arthritis pain, but it is unclear if this was specifically for the foot pain. The patient also mentions a recent change in his work environment, which now involves walking on a cement floor, but it is unclear if this has contributed to the foot pain.        Health Maintenance: Flu Shot     04/13/2023    3:50 PM 11/07/2022   10:56 AM 10/13/2022    8:28 AM  Depression screen PHQ 2/9  Decreased Interest 0 0 0  Down, Depressed, Hopeless 0 0 0  PHQ - 2 Score 0 0 0  Altered sleeping  0   Tired, decreased energy  0   Change  in appetite  0   Feeling bad or failure about yourself   0   Trouble concentrating  0   Moving slowly or fidgety/restless  0   Suicidal thoughts  0   PHQ-9 Score  0   Difficult doing work/chores  Not difficult at all        04/13/2023    3:50 PM 11/07/2022   10:57 AM 10/13/2022    8:28 AM 09/18/2021    2:54 PM  GAD 7 : Generalized Anxiety Score  Nervous, Anxious, on Edge 0 0 0 0  Control/stop worrying 0 0 0 0  Worry too much - different things 0 0 0 0  Trouble relaxing 0 0 0 0  Restless 0 0 0 0  Easily annoyed or irritable 0 0 0 0  Afraid - awful might happen 0 0 0 0  Total GAD 7 Score 0 0 0 0  Anxiety Difficulty  Not difficult at all  Not difficult at all    Social History   Tobacco Use   Smoking status: Never   Smokeless tobacco: Never  Vaping Use   Vaping status: Never Used  Substance Use Topics   Alcohol use: Yes   Drug use: No    Review of Systems Per HPI unless specifically indicated above     Objective:    BP 128/74 (BP Location: Left Arm, Patient Position: Sitting, Cuff Size: Normal)   Pulse 64  Ht 5\' 10"  (1.778 m)   Wt 190 lb (86.2 kg)   BMI 27.26 kg/m   Wt Readings from Last 3 Encounters:  04/13/23 190 lb (86.2 kg)  11/07/22 187 lb 3.2 oz (84.9 kg)  10/13/22 183 lb (83 kg)    Physical Exam Vitals and nursing note reviewed.  Constitutional:      General: He is not in acute distress.    Appearance: Normal appearance. He is well-developed. He is not diaphoretic.     Comments: Well-appearing, comfortable, cooperative  HENT:     Head: Normocephalic and atraumatic.  Eyes:     General:        Right eye: No discharge.        Left eye: No discharge.     Conjunctiva/sclera: Conjunctivae normal.  Cardiovascular:     Rate and Rhythm: Normal rate.  Pulmonary:     Effort: Pulmonary effort is normal.  Musculoskeletal:     Comments: R foot with mild enlarged MTP region, no erythema, no edema, no ecchymosis. Some mild dry skin callus.  Skin:     General: Skin is warm and dry.     Findings: No erythema or rash.  Neurological:     Mental Status: He is alert and oriented to person, place, and time.  Psychiatric:        Mood and Affect: Mood normal.        Behavior: Behavior normal.        Thought Content: Thought content normal.     Comments: Well groomed, good eye contact, normal speech and thoughts     Results for orders placed or performed in visit on 10/15/22  PSA   Collection Time: 10/15/22  8:18 AM  Result Value Ref Range   PSA 1.16 < OR = 4.00 ng/mL  Hemoglobin A1c   Collection Time: 10/15/22  8:18 AM  Result Value Ref Range   Hgb A1c MFr Bld 5.8 (H) <5.7 % of total Hgb   Mean Plasma Glucose 120 mg/dL   eAG (mmol/L) 6.6 mmol/L  Lipid panel   Collection Time: 10/15/22  8:18 AM  Result Value Ref Range   Cholesterol 171 <200 mg/dL   HDL 71 > OR = 40 mg/dL   Triglycerides 54 <401 mg/dL   LDL Cholesterol (Calc) 86 mg/dL (calc)   Total CHOL/HDL Ratio 2.4 <5.0 (calc)   Non-HDL Cholesterol (Calc) 100 <130 mg/dL (calc)  CBC with Differential/Platelet   Collection Time: 10/15/22  8:18 AM  Result Value Ref Range   WBC 3.2 (L) 3.8 - 10.8 Thousand/uL   RBC 3.74 (L) 4.20 - 5.80 Million/uL   Hemoglobin 10.0 (L) 13.2 - 17.1 g/dL   HCT 02.7 (L) 25.3 - 66.4 %   MCV 85.8 80.0 - 100.0 fL   MCH 26.7 (L) 27.0 - 33.0 pg   MCHC 31.2 (L) 32.0 - 36.0 g/dL   RDW 40.3 (H) 47.4 - 25.9 %   Platelets 369 140 - 400 Thousand/uL   MPV 9.6 7.5 - 12.5 fL   Neutro Abs 1,338 (L) 1,500 - 7,800 cells/uL   Lymphs Abs 1,130 850 - 3,900 cells/uL   Absolute Monocytes 445 200 - 950 cells/uL   Eosinophils Absolute 227 15 - 500 cells/uL   Basophils Absolute 61 0 - 200 cells/uL   Neutrophils Relative % 41.8 %   Total Lymphocyte 35.3 %   Monocytes Relative 13.9 %   Eosinophils Relative 7.1 %   Basophils Relative 1.9 %  COMPLETE METABOLIC PANEL WITH  GFR   Collection Time: 10/15/22  8:18 AM  Result Value Ref Range   Glucose, Bld 83 65 - 99 mg/dL    BUN 13 7 - 25 mg/dL   Creat 1.61 0.96 - 0.45 mg/dL   eGFR 79 > OR = 60 WU/JWJ/1.91Y7   BUN/Creatinine Ratio SEE NOTE: 6 - 22 (calc)   Sodium 139 135 - 146 mmol/L   Potassium 4.4 3.5 - 5.3 mmol/L   Chloride 105 98 - 110 mmol/L   CO2 26 20 - 32 mmol/L   Calcium 8.7 8.6 - 10.3 mg/dL   Total Protein 7.0 6.1 - 8.1 g/dL   Albumin 4.1 3.6 - 5.1 g/dL   Globulin 2.9 1.9 - 3.7 g/dL (calc)   AG Ratio 1.4 1.0 - 2.5 (calc)   Total Bilirubin 0.4 0.2 - 1.2 mg/dL   Alkaline phosphatase (APISO) 45 35 - 144 U/L   AST 15 10 - 35 U/L   ALT 9 9 - 46 U/L      Assessment & Plan:   Problem List Items Addressed This Visit   None Visit Diagnoses       Chronic foot pain, right    -  Primary   Relevant Medications   meloxicam (MOBIC) 15 MG tablet   Other Relevant Orders   DG Foot Complete Right     Flu vaccine need       Relevant Orders   Flu vaccine trivalent PF, 6mos and older(Flulaval,Afluria,Fluarix,Fluzone) (Completed)       R Foot Pain Intermittent pain in the right foot, specifically around the big toe, for the past three months. Pain is not severe enough to limit activities. No current pain. Suspected arthritis or bone spur.  -Order foot x-ray to confirm diagnosis. -Prescribe Meloxicam as needed for pain, to be taken for short periods (2-3 days) during flare-ups. Future consider Uric Acid, eval for gout possibility -Consider referral to podiatry if pain persists or x-ray reveals significant issue.  Elevated Blood Pressure Initial reading of 150/90, but subsequent reading was 128/74. No history of hypertension. -Advise patient to purchase home blood pressure monitor and check readings occasionally. -Recheck blood pressure at next visit.  General Health Maintenance -Administered flu shot during visit.         Orders Placed This Encounter  Procedures   DG Foot Complete Right    Standing Status:   Future    Number of Occurrences:   1    Expiration Date:   04/12/2024    Reason  for Exam (SYMPTOM  OR DIAGNOSIS REQUIRED):   Right foot pain    Preferred imaging location?:   [facility]   Flu vaccine trivalent PF, 6mos and older(Flulaval,Afluria,Fluarix,Fluzone)    Meds ordered this encounter  Medications   meloxicam (MOBIC) 15 MG tablet    Sig: Take 1 tablet (15 mg total) by mouth daily as needed for pain.    Dispense:  30 tablet    Refill:  2    Follow up plan: Return if symptoms worsen or fail to improve.    Saralyn Pilar, DO [facility] 04/13/2023, 4:07 PM

## 2023-04-14 ENCOUNTER — Encounter: Payer: Self-pay | Admitting: Family Medicine

## 2023-06-01 ENCOUNTER — Encounter: Payer: Self-pay | Admitting: Family Medicine

## 2023-06-01 ENCOUNTER — Ambulatory Visit: Payer: BC Managed Care – PPO | Admitting: Family Medicine

## 2023-06-01 VITALS — BP 110/78 | HR 82 | Ht 70.0 in | Wt 183.0 lb

## 2023-06-01 DIAGNOSIS — K5900 Constipation, unspecified: Secondary | ICD-10-CM

## 2023-06-01 NOTE — Progress Notes (Signed)
Subjective:    Patient ID: Nicholas Weber, male    DOB: 1960-06-18, 63 y.o.   MRN: [id number]  Nicholas Weber is a 63 y.o. male presenting on 06/01/2023 for Constipation (/)   HPI  Discussed the use of AI scribe software for clinical note transcription with the patient, who gave verbal consent to proceed.  History of Present Illness    The patient presents with episodic left-sided abdominal pain and constipation  He experiences episodic left-sided abdominal pain, described as achy rather than sharp or stabbing. The pain occurs a couple of times a week and is not a daily occurrence. It is located on the left side, from the stomach to the side, and does not radiate to the back. The pain is significant enough to prompt him to seek medical evaluation, although he noted feeling better on the day of the visit. No symptoms recently or today.  He experiences constipation and uses Metamucil to aid bowel movements, which he finds helpful. He acknowledges difficulty in maintaining a consistent diet. He also reports occasional hemorrhoid bleeding, which he associates with constipation.  He has a history of alcohol consumption but currently drinks only a couple of glasses of wine a week, sometimes none. He expressed concern about potential liver issues due to past drinking habits. Previous liver function tests in June 2024 were normal.  Additionally, he mentions soreness inside his nose, sometimes on both sides, which can be exacerbated by applying alcohol. He has not used any ointments or creams to address this issue.          06/01/2023   11:30 AM 04/13/2023    3:50 PM 11/07/2022   10:56 AM  Depression screen PHQ 2/9  Decreased Interest 0 0 0  Down, Depressed, Hopeless 0 0 0  PHQ - 2 Score 0 0 0  Altered sleeping   0  Tired, decreased energy   0  Change in appetite   0  Feeling bad or failure about yourself    0  Trouble concentrating   0  Moving slowly or fidgety/restless   0   Suicidal thoughts   0  PHQ-9 Score   0  Difficult doing work/chores   Not difficult at all       06/01/2023   11:30 AM 04/13/2023    3:50 PM 11/07/2022   10:57 AM 10/13/2022    8:28 AM  GAD 7 : Generalized Anxiety Score  Nervous, Anxious, on Edge 0 0 0 0  Control/stop worrying 0 0 0 0  Worry too much - different things 0 0 0 0  Trouble relaxing 0 0 0 0  Restless 0 0 0 0  Easily annoyed or irritable 0 0 0 0  Afraid - awful might happen 0 0 0 0  Total GAD 7 Score 0 0 0 0  Anxiety Difficulty   Not difficult at all     Social History   Tobacco Use   Smoking status: Never   Smokeless tobacco: Never  Vaping Use   Vaping status: Never Used  Substance Use Topics   Alcohol use: Yes   Drug use: No    Review of Systems Per HPI unless specifically indicated above     Objective:    BP 110/78   Pulse 82   Ht 5\' 10"  (1.778 m)   Wt 183 lb (83 kg)   SpO2 93%   BMI 26.26 kg/m   Wt Readings from Last 3 Encounters:  06/01/23 183 lb (  83 kg)  04/13/23 190 lb (86.2 kg)  11/07/22 187 lb 3.2 oz (84.9 kg)    Physical Exam Vitals and nursing note reviewed.  Constitutional:      General: He is not in acute distress.    Appearance: Normal appearance. He is well-developed. He is not diaphoretic.     Comments: Well-appearing, comfortable, cooperative  HENT:     Head: Normocephalic and atraumatic.  Eyes:     General:        Right eye: No discharge.        Left eye: No discharge.     Conjunctiva/sclera: Conjunctivae normal.  Cardiovascular:     Rate and Rhythm: Normal rate.  Pulmonary:     Effort: Pulmonary effort is normal.  Abdominal:     General: Bowel sounds are normal. There is no distension.     Tenderness: There is no abdominal tenderness. There is no guarding or rebound.  Musculoskeletal:     Comments: No flank or back tenderness on exam.  Skin:    General: Skin is warm and dry.     Findings: No erythema or rash.  Neurological:     Mental Status: He is alert and  oriented to person, place, and time.  Psychiatric:        Mood and Affect: Mood normal.        Behavior: Behavior normal.        Thought Content: Thought content normal.     Comments: Well groomed, good eye contact, normal speech and thoughts     Results for orders placed or performed in visit on 10/15/22  PSA   Collection Time: 10/15/22  8:18 AM  Result Value Ref Range   PSA 1.16 < OR = 4.00 ng/mL  Hemoglobin A1c   Collection Time: 10/15/22  8:18 AM  Result Value Ref Range   Hgb A1c MFr Bld 5.8 (H) <5.7 % of total Hgb   Mean Plasma Glucose 120 mg/dL   eAG (mmol/L) 6.6 mmol/L  Lipid panel   Collection Time: 10/15/22  8:18 AM  Result Value Ref Range   Cholesterol 171 <200 mg/dL   HDL 71 > OR = 40 mg/dL   Triglycerides 54 <295 mg/dL   LDL Cholesterol (Calc) 86 mg/dL (calc)   Total CHOL/HDL Ratio 2.4 <5.0 (calc)   Non-HDL Cholesterol (Calc) 100 <130 mg/dL (calc)  CBC with Differential/Platelet   Collection Time: 10/15/22  8:18 AM  Result Value Ref Range   WBC 3.2 (L) 3.8 - 10.8 Thousand/uL   RBC 3.74 (L) 4.20 - 5.80 Million/uL   Hemoglobin 10.0 (L) 13.2 - 17.1 g/dL   HCT 62.1 (L) 30.8 - 65.7 %   MCV 85.8 80.0 - 100.0 fL   MCH 26.7 (L) 27.0 - 33.0 pg   MCHC 31.2 (L) 32.0 - 36.0 g/dL   RDW 84.6 (H) 96.2 - 95.2 %   Platelets 369 140 - 400 Thousand/uL   MPV 9.6 7.5 - 12.5 fL   Neutro Abs 1,338 (L) 1,500 - 7,800 cells/uL   Lymphs Abs 1,130 850 - 3,900 cells/uL   Absolute Monocytes 445 200 - 950 cells/uL   Eosinophils Absolute 227 15 - 500 cells/uL   Basophils Absolute 61 0 - 200 cells/uL   Neutrophils Relative % 41.8 %   Total Lymphocyte 35.3 %   Monocytes Relative 13.9 %   Eosinophils Relative 7.1 %   Basophils Relative 1.9 %  COMPLETE METABOLIC PANEL WITH GFR   Collection Time: 10/15/22  8:18 AM  Result Value Ref Range   Glucose, Bld 83 65 - 99 mg/dL   BUN 13 7 - 25 mg/dL   Creat 1.61 0.96 - 0.45 mg/dL   eGFR 79 > OR = 60 WU/JWJ/1.91Y7   BUN/Creatinine Ratio SEE  NOTE: 6 - 22 (calc)   Sodium 139 135 - 146 mmol/L   Potassium 4.4 3.5 - 5.3 mmol/L   Chloride 105 98 - 110 mmol/L   CO2 26 20 - 32 mmol/L   Calcium 8.7 8.6 - 10.3 mg/dL   Total Protein 7.0 6.1 - 8.1 g/dL   Albumin 4.1 3.6 - 5.1 g/dL   Globulin 2.9 1.9 - 3.7 g/dL (calc)   AG Ratio 1.4 1.0 - 2.5 (calc)   Total Bilirubin 0.4 0.2 - 1.2 mg/dL   Alkaline phosphatase (APISO) 45 35 - 144 U/L   AST 15 10 - 35 U/L   ALT 9 9 - 46 U/L      Assessment & Plan:   Problem List Items Addressed This Visit     Constipation - Primary      Episodic Left Flank Pain Described as achy, non-daily, and not severe. Differential includes musculoskeletal pain, renal colic, and gastrointestinal issues. Given the patient's history of constipation and hemorrhoids, the pain may be related to bowel movements. -Trial of Miralax for potential constipation-related pain.  Nasal Soreness Episodic soreness inside the nose, possibly related to minor skin breaks or irritation. -Apply Neosporin to affected areas as needed.  General Health Maintenance -Continue routine physical scheduled for October 16, 2023. -Assist patient with MyChart password reset for access to medical records.         No orders of the defined types were placed in this encounter.   No orders of the defined types were placed in this encounter.   Follow up plan: Return if symptoms worsen or fail to improve.    Saralyn Pilar, DO [facility] 06/01/2023, 11:18 AM

## 2023-06-01 NOTE — Patient Instructions (Addendum)
Thank you for coming to the office today.  For Constipation (less frequent bowel movement that can be hard dry or involve straining).  Recommend trying OTC Miralax 17g = 1 capful in large glass water once daily for now, try several days to see if working, goal is soft stool or BM 1-2 times daily, if too loose then reduce dose or try every other day. If not effective may need to increase it to 2 doses at once in AM or may do 1 in morning and 1 in afternoon/evening  - This medicine is very safe and can be used often without any problem and will not make you dehydrated. It is good for use on AS NEEDED BASIS or even MAINTENANCE therapy for longer term for several days to weeks at a time to help regulate bowel movements  Other more natural remedies or preventative treatment: - Increase hydration with water - Increase fiber in diet (high fiber foods = vegetables, leafy greens, oats/grains) - May take OTC Fiber supplement (metamucil powder or pill/gummy) - May try OTC Probiotic   Please schedule a Follow-up Appointment to: Return if symptoms worsen or fail to improve.  If you have any other questions or concerns, please feel free to call the office or send a message through MyChart. You may also schedule an earlier appointment if necessary.  Additionally, you may be receiving a survey about your experience at our office within a few days to 1 week by e-mail or mail. We value your feedback.  Saralyn Pilar, DO [facility], [place]
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
## 2023-08-21 ENCOUNTER — Encounter: Payer: Self-pay | Admitting: Family Medicine

## 2023-08-21 ENCOUNTER — Other Ambulatory Visit: Payer: Self-pay | Admitting: Family Medicine

## 2023-08-21 ENCOUNTER — Ambulatory Visit: Admitting: Family Medicine

## 2023-08-21 VITALS — BP 130/62 | HR 65 | Resp 17 | Ht 70.0 in | Wt 188.4 lb

## 2023-08-21 DIAGNOSIS — R7309 Other abnormal glucose: Secondary | ICD-10-CM

## 2023-08-21 DIAGNOSIS — R399 Unspecified symptoms and signs involving the genitourinary system: Secondary | ICD-10-CM

## 2023-08-21 DIAGNOSIS — Z125 Encounter for screening for malignant neoplasm of prostate: Secondary | ICD-10-CM

## 2023-08-21 DIAGNOSIS — R350 Frequency of micturition: Secondary | ICD-10-CM

## 2023-08-21 DIAGNOSIS — R3915 Urgency of urination: Secondary | ICD-10-CM | POA: Diagnosis not present

## 2023-08-21 DIAGNOSIS — R7989 Other specified abnormal findings of blood chemistry: Secondary | ICD-10-CM

## 2023-08-21 DIAGNOSIS — Z0000 Encounter for general adult medical examination without abnormal findings: Secondary | ICD-10-CM

## 2023-08-21 DIAGNOSIS — D649 Anemia, unspecified: Secondary | ICD-10-CM

## 2023-08-21 MED ORDER — SAW PALMETTO (SERENOA REPENS) 160 MG PO CAPS: 160.0000 mg | ORAL_CAPSULE | Freq: Two times a day (BID) | ORAL | Status: AC

## 2023-08-21 NOTE — Patient Instructions (Addendum)
 Thank you for coming to the office today.  Goal to avoid bladder irritants such as caffeine, coffee tea alcohol  Saw Palmetto 80-160-320 somewhere in that dose range, follow instructions twice a day  For improving prostate function  Should reduce - urgency, frequency, improve emptying, less waking up at night, improve stream and flow  If the herbal is not strong enough  Recommend Flomax (Tamsulosin) 0.4mg  daily  We will check the urine today for infection and other conditions, result next week  We can order antibiotic if needed based on urine testing.  Colon Cancer Screening: NEXT TIME - we will order the Cologuard (home kit) test for colon cancer screening. Stay tuned for further updates.  It will be shipped to you directly. If not received in 2-4 weeks, call us  or the company.   If you send it back and no results are received in 2-4 weeks, call us  or the company as well!   Colon Cancer Screening: - For all adults age 62+ routine colon cancer screening is highly recommended.     - Recent guidelines from [employer] Society recommend starting age of 27 - Early detection of colon cancer is important, because often there are no warning signs or symptoms, also if found early usually it can be cured. Late stage is hard to treat.   - If Cologuard is NEGATIVE, then it is good for 3 years before next due - If Cologuard is POSITIVE, then it is strongly advised to get a Colonoscopy, which allows the GI doctor to locate the source of the cancer or polyp (even very early stage) and treat it by removing it. ------------------------- Follow instructions to collect sample, you may call the company for any help or questions, 24/7 telephone support at [phone].   DUE for FASTING BLOOD WORK (no food or drink after midnight before the lab appointment, only water or coffee without cream/sugar on the morning of)  SCHEDULE "Lab Only" visit in the morning at the clinic for lab draw in 4  MONTHS   - Make sure Lab Only appointment is at about 1 week before your next appointment, so that results will be available  For Lab Results, once available within 2-3 days of blood draw, you can can log in to MyChart online to view your results and a brief explanation. Also, we can discuss results at next follow-up visit.    Please schedule a Follow-up Appointment to: Return in about 4 months (around 12/21/2023) for 3 month fasting lab > 1 week later Annual Physical.  If you have any other questions or concerns, please feel free to call the office or send a message through MyChart. You may also schedule an earlier appointment if necessary.  Additionally, you may be receiving a survey about your experience at our office within a few days to 1 week by e-mail or mail. We value your feedback.  Domingo Friend, DO [facility], [place]

## 2023-08-21 NOTE — Progress Notes (Signed)
 Subjective:    Patient ID: Nicholas Weber, male    DOB: 09/16/1960, 63 y.o.   MRN: [id number]  Nicholas Weber is a 63 y.o. male presenting on 08/21/2023 for Urinary Frequency  Patient presents for a same day appointment.  HPI  Discussed the use of AI scribe software for clinical note transcription with the patient, who gave verbal consent to proceed.  History of Present Illness   Nicholas Weber is a 63 year old male who presents with urinary frequency and urgency.  He has been experiencing urinary frequency and urgency for the past two days, needing to urinate every 10 to 15 minutes with a sensation of incomplete bladder emptying. He describes the urgency as significant and mentions a possible mild burning sensation, though the primary issue is frequency and urgency.  He denies significant caffeine intake, drinking decaf coffee in the mornings and occasionally consuming wine, tea, and soda. He has not noticed any significant changes in his diet or fluid intake that could explain the symptoms.  He has not previously experienced similar urinary symptoms and has not been treated for prostate issues before. He notes a decrease in urinary flow, describing it as a 'slow down,' and experiences dribbling and a weaker stream. He also reports waking up at night to urinate.         08/21/2023    2:03 PM 06/01/2023   11:30 AM 04/13/2023    3:50 PM  Depression screen PHQ 2/9  Decreased Interest 0 0 0  Down, Depressed, Hopeless 0 0 0  PHQ - 2 Score 0 0 0  Altered sleeping 0    Tired, decreased energy 0    Change in appetite 0    Feeling bad or failure about yourself  0    Trouble concentrating 0    Moving slowly or fidgety/restless 0    Suicidal thoughts 0    PHQ-9 Score 0    Difficult doing work/chores Not difficult at all         08/21/2023    2:04 PM 06/01/2023   11:30 AM 04/13/2023    3:50 PM 11/07/2022   10:57 AM  GAD 7 : Generalized Anxiety Score  Nervous, Anxious, on Edge  0 0 0 0  Control/stop worrying 0 0 0 0  Worry too much - different things 0 0 0 0  Trouble relaxing 0 0 0 0  Restless 0 0 0 0  Easily annoyed or irritable 0 0 0 0  Afraid - awful might happen 0 0 0 0  Total GAD 7 Score 0 0 0 0  Anxiety Difficulty Not difficult at all   Not difficult at all    Social History   Tobacco Use   Smoking status: Never   Smokeless tobacco: Never  Vaping Use   Vaping status: Never Used  Substance Use Topics   Alcohol use: Yes   Drug use: No    Review of Systems Per HPI unless specifically indicated above     Objective:    BP 130/62 (BP Location: Left Arm, Patient Position: Sitting, Cuff Size: Normal)   Pulse 65   Resp 17   Ht 5\' 10"  (1.778 m)   Wt 188 lb 6.4 oz (85.5 kg)   SpO2 98%   BMI 27.03 kg/m   Wt Readings from Last 3 Encounters:  08/21/23 188 lb 6.4 oz (85.5 kg)  06/01/23 183 lb (83 kg)  04/13/23 190 lb (86.2 kg)    Physical Exam  Vitals and nursing note reviewed.  Constitutional:      General: He is not in acute distress.    Appearance: Normal appearance. He is well-developed. He is not diaphoretic.     Comments: Well-appearing, comfortable, cooperative  HENT:     Head: Normocephalic and atraumatic.  Eyes:     General:        Right eye: No discharge.        Left eye: No discharge.     Conjunctiva/sclera: Conjunctivae normal.  Cardiovascular:     Rate and Rhythm: Normal rate.  Pulmonary:     Effort: Pulmonary effort is normal.  Skin:    General: Skin is warm and dry.     Findings: No erythema or rash.  Neurological:     Mental Status: He is alert and oriented to person, place, and time.  Psychiatric:        Mood and Affect: Mood normal.        Behavior: Behavior normal.        Thought Content: Thought content normal.     Comments: Well groomed, good eye contact, normal speech and thoughts     Results for orders placed or performed in visit on 10/15/22  PSA   Collection Time: 10/15/22  8:18 AM  Result Value Ref  Range   PSA 1.16 < OR = 4.00 ng/mL  Hemoglobin A1c   Collection Time: 10/15/22  8:18 AM  Result Value Ref Range   Hgb A1c MFr Bld 5.8 (H) <5.7 % of total Hgb   Mean Plasma Glucose 120 mg/dL   eAG (mmol/L) 6.6 mmol/L  Lipid panel   Collection Time: 10/15/22  8:18 AM  Result Value Ref Range   Cholesterol 171 <200 mg/dL   HDL 71 > OR = 40 mg/dL   Triglycerides 54 <696 mg/dL   LDL Cholesterol (Calc) 86 mg/dL (calc)   Total CHOL/HDL Ratio 2.4 <5.0 (calc)   Non-HDL Cholesterol (Calc) 100 <130 mg/dL (calc)  CBC with Differential/Platelet   Collection Time: 10/15/22  8:18 AM  Result Value Ref Range   WBC 3.2 (L) 3.8 - 10.8 Thousand/uL   RBC 3.74 (L) 4.20 - 5.80 Million/uL   Hemoglobin 10.0 (L) 13.2 - 17.1 g/dL   HCT 29.5 (L) 28.4 - 13.2 %   MCV 85.8 80.0 - 100.0 fL   MCH 26.7 (L) 27.0 - 33.0 pg   MCHC 31.2 (L) 32.0 - 36.0 g/dL   RDW 44.0 (H) 10.2 - 72.5 %   Platelets 369 140 - 400 Thousand/uL   MPV 9.6 7.5 - 12.5 fL   Neutro Abs 1,338 (L) 1,500 - 7,800 cells/uL   Lymphs Abs 1,130 850 - 3,900 cells/uL   Absolute Monocytes 445 200 - 950 cells/uL   Eosinophils Absolute 227 15 - 500 cells/uL   Basophils Absolute 61 0 - 200 cells/uL   Neutrophils Relative % 41.8 %   Total Lymphocyte 35.3 %   Monocytes Relative 13.9 %   Eosinophils Relative 7.1 %   Basophils Relative 1.9 %  COMPLETE METABOLIC PANEL WITH GFR   Collection Time: 10/15/22  8:18 AM  Result Value Ref Range   Glucose, Bld 83 65 - 99 mg/dL   BUN 13 7 - 25 mg/dL   Creat 3.66 4.40 - 3.47 mg/dL   eGFR 79 > OR = 60 QQ/VZD/6.38V5   BUN/Creatinine Ratio SEE NOTE: 6 - 22 (calc)   Sodium 139 135 - 146 mmol/L   Potassium 4.4 3.5 - 5.3 mmol/L  Chloride 105 98 - 110 mmol/L   CO2 26 20 - 32 mmol/L   Calcium 8.7 8.6 - 10.3 mg/dL   Total Protein 7.0 6.1 - 8.1 g/dL   Albumin 4.1 3.6 - 5.1 g/dL   Globulin 2.9 1.9 - 3.7 g/dL (calc)   AG Ratio 1.4 1.0 - 2.5 (calc)   Total Bilirubin 0.4 0.2 - 1.2 mg/dL   Alkaline phosphatase  (APISO) 45 35 - 144 U/L   AST 15 10 - 35 U/L   ALT 9 9 - 46 U/L      Assessment & Plan:   Problem List Items Addressed This Visit   None Visit Diagnoses       Urinary frequency    -  Primary   Relevant Orders   Urine Culture     Urinary urgency       Relevant Orders   Urine Culture     Lower urinary tract symptoms (LUTS)           Urinary Frequency / urgency Suspected. Benign prostatic hyperplasia (BPH) Differential includes bladder infection or irritants. Prostate enlargement likely. Urine test to rule out infection. - Order urine test to rule out infection. / Urine culture sent - Advise OTC saw palmetto, 160 mg twice daily. - Consider Flomax (tamsulosin) if symptoms persist. - Advise avoiding bladder irritants such as caffeine, alcohol, and tea.  Colorectal cancer screening Due for screening in August. Discussed colonoscopy and Cologuard. He preferred Cologuard.       Orders Placed This Encounter  Procedures   Urine Culture    Meds ordered this encounter  Medications   saw palmetto 160 MG capsule    Sig: Take 1 capsule (160 mg total) by mouth 2 (two) times daily.    Follow up plan: Return in about 4 months (around 12/21/2023) for 3 month fasting lab > 1 week later Annual Physical.  Future labs ordered for 12/14/23   Domingo Friend, DO [facility] 08/21/2023, 2:07 PM

## 2023-08-22 LAB — URINE CULTURE
MICRO NUMBER:: 16377022
Result:: NO GROWTH
SPECIMEN QUALITY:: ADEQUATE

## 2023-10-16 ENCOUNTER — Encounter: Payer: Self-pay | Admitting: Family Medicine

## 2023-12-14 ENCOUNTER — Other Ambulatory Visit

## 2023-12-14 DIAGNOSIS — D649 Anemia, unspecified: Secondary | ICD-10-CM

## 2023-12-14 DIAGNOSIS — R3915 Urgency of urination: Secondary | ICD-10-CM | POA: Diagnosis not present

## 2023-12-14 DIAGNOSIS — Z0000 Encounter for general adult medical examination without abnormal findings: Secondary | ICD-10-CM

## 2023-12-14 DIAGNOSIS — Z125 Encounter for screening for malignant neoplasm of prostate: Secondary | ICD-10-CM | POA: Diagnosis not present

## 2023-12-14 DIAGNOSIS — R7989 Other specified abnormal findings of blood chemistry: Secondary | ICD-10-CM | POA: Diagnosis not present

## 2023-12-14 DIAGNOSIS — R399 Unspecified symptoms and signs involving the genitourinary system: Secondary | ICD-10-CM

## 2023-12-14 DIAGNOSIS — R7309 Other abnormal glucose: Secondary | ICD-10-CM

## 2023-12-15 LAB — CBC WITH DIFFERENTIAL/PLATELET
Absolute Lymphocytes: 1064 {cells}/uL (ref 850–3900)
Absolute Monocytes: 486 {cells}/uL (ref 200–950)
Basophils Absolute: 61 {cells}/uL (ref 0–200)
Basophils Relative: 1.8 %
Eosinophils Absolute: 354 {cells}/uL (ref 15–500)
Eosinophils Relative: 10.4 %
HCT: 39 % (ref 38.5–50.0)
Hemoglobin: 12.6 g/dL — ABNORMAL LOW (ref 13.2–17.1)
MCH: 29.7 pg (ref 27.0–33.0)
MCHC: 32.3 g/dL (ref 32.0–36.0)
MCV: 92 fL (ref 80.0–100.0)
MPV: 10 fL (ref 7.5–12.5)
Monocytes Relative: 14.3 %
Neutro Abs: 1435 {cells}/uL — ABNORMAL LOW (ref 1500–7800)
Neutrophils Relative %: 42.2 %
Platelets: 259 Thousand/uL (ref 140–400)
RBC: 4.24 Million/uL (ref 4.20–5.80)
RDW: 13.2 % (ref 11.0–15.0)
Total Lymphocyte: 31.3 %
WBC: 3.4 Thousand/uL — ABNORMAL LOW (ref 3.8–10.8)

## 2023-12-15 LAB — COMPREHENSIVE METABOLIC PANEL WITH GFR
AG Ratio: 1.8 (calc) (ref 1.0–2.5)
ALT: 14 U/L (ref 9–46)
AST: 19 U/L (ref 10–35)
Albumin: 4.4 g/dL (ref 3.6–5.1)
Alkaline phosphatase (APISO): 53 U/L (ref 35–144)
BUN: 17 mg/dL (ref 7–25)
CO2: 27 mmol/L (ref 20–32)
Calcium: 9.1 mg/dL (ref 8.6–10.3)
Chloride: 106 mmol/L (ref 98–110)
Creat: 1.01 mg/dL (ref 0.70–1.35)
Globulin: 2.5 g/dL (ref 1.9–3.7)
Glucose, Bld: 95 mg/dL (ref 65–99)
Potassium: 4.3 mmol/L (ref 3.5–5.3)
Sodium: 141 mmol/L (ref 135–146)
Total Bilirubin: 0.8 mg/dL (ref 0.2–1.2)
Total Protein: 6.9 g/dL (ref 6.1–8.1)
eGFR: 84 mL/min/1.73m2 (ref >=60)

## 2023-12-15 LAB — URINALYSIS, ROUTINE W REFLEX MICROSCOPIC
Bilirubin Urine: NEGATIVE
Glucose, UA: NEGATIVE
Hgb urine dipstick: NEGATIVE
Ketones, ur: NEGATIVE
Leukocytes,Ua: NEGATIVE
Nitrite: NEGATIVE
Protein, ur: NEGATIVE
Specific Gravity, Urine: 1.022 (ref 1.001–1.035)
pH: 5.5 (ref 5.0–8.0)

## 2023-12-15 LAB — IRON,TIBC AND FERRITIN PANEL
%SAT: 19 % — ABNORMAL LOW (ref 20–48)
Ferritin: 14 ng/mL — ABNORMAL LOW (ref 24–380)
Iron: 60 ng/mL — AB (ref 50–380)
TIBC: 320 ug/dL (ref 250–425)

## 2023-12-15 LAB — HEMOGLOBIN A1C
Hgb A1c MFr Bld: 5.6 % (ref <5.7)
Mean Plasma Glucose: 114 mg/dL
eAG (mmol/L): 6.3 mmol/L

## 2023-12-15 LAB — LIPID PANEL
Cholesterol: 163 mg/dL (ref <200)
HDL: 75 mg/dL (ref >=40)
LDL Cholesterol (Calc): 75 mg/dL
Non-HDL Cholesterol (Calc): 88 mg/dL (ref <130)
Total CHOL/HDL Ratio: 2.2 (calc) (ref <5.0)
Triglycerides: 45 mg/dL (ref <150)

## 2023-12-15 LAB — PSA: PSA: 0.38 ng/mL (ref <=4.00)

## 2023-12-21 ENCOUNTER — Encounter: Payer: Self-pay | Admitting: Family Medicine

## 2023-12-21 ENCOUNTER — Ambulatory Visit (INDEPENDENT_AMBULATORY_CARE_PROVIDER_SITE_OTHER): Admitting: Family Medicine

## 2023-12-21 VITALS — BP 112/75 | HR 68 | Wt 184.8 lb

## 2023-12-21 DIAGNOSIS — Z1211 Encounter for screening for malignant neoplasm of colon: Secondary | ICD-10-CM

## 2023-12-21 DIAGNOSIS — Z0000 Encounter for general adult medical examination without abnormal findings: Secondary | ICD-10-CM | POA: Diagnosis not present

## 2023-12-21 NOTE — Progress Notes (Signed)
 Subjective:    Patient ID: Nicholas Weber, male    DOB: 24-Jul-1960, 63 y.o.   MRN: [id number]  Nicholas Weber is a 63 y.o. male presenting on 12/21/2023 for Annual Exam and Foot Pain (Left, pain is triggered with running and working out, stretches help some, intermittent )   HPI  Discussed the use of AI scribe software for clinical note transcription with the patient, who gave verbal consent to proceed.  History of Present Illness   Nicholas Weber is a 63 year old male who presents for an annual physical exam.  Dermatologic symptoms - Dry skin on feet - Uses various creams for management  Musculoskeletal symptoms - Arthritis managed with meloxicam  and physical activity - Movement alleviates arthritic symptoms  Lifestyle / Wellness Reports no new concerns today  Lab results are excellent today Lipid panel shows LDL 75 A1c 5.6 improved from 5.8  - Diet: improved balanced - Fam history of DM - Occasionally drinking glass of wine - He is improving regular walking/running exercise - often running/jogging exercise   Mild Normocytic Anemia / Iron Deficiency Hemoglobin 12.6 range improved Iron reserves TSat Ferritin slightly low  History Hemorrhoids  BPH Urinary Frequency Last visit 07/2023, diagnosed clinical BPH, treated with Saw Palmetto  supplement and he has shown some improvement, he often takes only once a day.    Lab results   Health Maintenance:  Offered Prevnar-20, declined today.   Shingles vaccine due, he will check cost coverage.   Colon CA Screening: Last Colonoscopy (1st) 2014 approx (done by [facility] near [facility], think it was [employer]), results with no polyps, good for 10 years. Currently asymptomatic. No known family history of colon CA   Due Colonoscopy 2025 or he elected to proceed w/ Cologuard this year since prior had no polyps.   Prostate CA Screening: Currently asymptomatic. No known family history of prostate  CA but possibly may have paternal uncle in age 29s with prostate problem  - PSA 0.38 negative, 1 year ago 1.16      12/21/2023    9:14 AM 08/21/2023    2:03 PM 06/01/2023   11:30 AM  Depression screen PHQ 2/9  Decreased Interest 0 0 0  Down, Depressed, Hopeless 0 0 0  PHQ - 2 Score 0 0 0  Altered sleeping 0 0   Tired, decreased energy 0 0   Change in appetite 0 0   Feeling bad or failure about yourself  0 0   Trouble concentrating 0 0   Moving slowly or fidgety/restless 0 0   Suicidal thoughts 0 0   PHQ-9 Score 0 0   Difficult doing work/chores  Not difficult at all        12/21/2023    9:14 AM 08/21/2023    2:04 PM 06/01/2023   11:30 AM 04/13/2023    3:50 PM  GAD 7 : Generalized Anxiety Score  Nervous, Anxious, on Edge 0 0 0 0  Control/stop worrying 0 0 0 0  Worry too much - different things 0 0 0 0  Trouble relaxing 0 0 0 0  Restless 0 0 0 0  Easily annoyed or irritable 0 0 0 0  Afraid - awful might happen 0 0 0 0  Total GAD 7 Score 0 0 0 0  Anxiety Difficulty  Not difficult at all       Past Medical History:  Diagnosis Date   Bleeding external hemorrhoids 11/25/2016   Constipation  11/25/2016   Past Surgical History:  Procedure Laterality Date   COLONOSCOPY  2016   WRIST GANGLION EXCISION Left 2012   Social History   Socioeconomic History   Marital status: Married    Spouse name: Ramari Bray   Number of children: Not on file   Years of education: Not on file   Highest education level: Not on file  Occupational History   Not on file  Tobacco Use   Smoking status: Never   Smokeless tobacco: Never  Vaping Use   Vaping status: Never Used  Substance and Sexual Activity   Alcohol use: Yes   Drug use: No   Sexual activity: Yes    Partners: Female  Other Topics Concern   Not on file  Social History Narrative   Not on file   Social Drivers of Health   Financial Resource Strain: Not on file  Food Insecurity: Not on file  Transportation Needs: Not on  file  Physical Activity: Not on file  Stress: Not on file  Social Connections: Not on file  Intimate Partner Violence: Not on file   Family History  Problem Relation Age of Onset   Hypertension Mother    Diabetes Father    Prostate cancer Paternal Uncle 64       unsure exactly if cancer   Colon cancer Neg Hx    Current Outpatient Medications on File Prior to Visit  Medication Sig   meloxicam  (MOBIC ) 15 MG tablet Take 1 tablet (15 mg total) by mouth daily as needed for pain.   Multiple Vitamin (MULTIVITAMIN) tablet Take 1 tablet by mouth daily.   saw palmetto  160 MG capsule Take 1 capsule (160 mg total) by mouth 2 (two) times daily.   No current facility-administered medications on file prior to visit.    Review of Systems  Constitutional:  Negative for activity change, appetite change, chills, diaphoresis, fatigue and fever.  HENT:  Negative for congestion and hearing loss.   Eyes:  Negative for visual disturbance.  Respiratory:  Negative for cough, chest tightness, shortness of breath and wheezing.   Cardiovascular:  Negative for chest pain, palpitations and leg swelling.  Gastrointestinal:  Negative for abdominal pain, constipation, diarrhea, nausea and vomiting.  Genitourinary:  Negative for dysuria, frequency and hematuria.  Musculoskeletal:  Negative for arthralgias and neck pain.  Skin:  Negative for rash.  Neurological:  Negative for dizziness, weakness, light-headedness, numbness and headaches.  Hematological:  Negative for adenopathy.  Psychiatric/Behavioral:  Negative for behavioral problems, dysphoric mood and sleep disturbance.    Per HPI unless specifically indicated above     Objective:    BP 112/75 (BP Location: Right Arm, Patient Position: Sitting, Cuff Size: Large)   Pulse 68   Wt 184 lb 12.8 oz (83.8 kg)   SpO2 97%   BMI 26.52 kg/m   Wt Readings from Last 3 Encounters:  12/21/23 184 lb 12.8 oz (83.8 kg)  08/21/23 188 lb 6.4 oz (85.5 kg)  06/01/23  183 lb (83 kg)    Physical Exam Vitals and nursing note reviewed.  Constitutional:      General: He is not in acute distress.    Appearance: He is well-developed. He is not diaphoretic.     Comments: Well-appearing, comfortable, cooperative  HENT:     Head: Normocephalic and atraumatic.  Eyes:     General:        Right eye: No discharge.        Left eye: No discharge.  Conjunctiva/sclera: Conjunctivae normal.     Pupils: Pupils are equal, round, and reactive to light.  Neck:     Thyroid: No thyromegaly.     Vascular: No carotid bruit.  Cardiovascular:     Rate and Rhythm: Normal rate and regular rhythm.     Pulses: Normal pulses.     Heart sounds: Normal heart sounds. No murmur heard. Pulmonary:     Effort: Pulmonary effort is normal. No respiratory distress.     Breath sounds: Normal breath sounds. No wheezing or rales.  Abdominal:     General: Bowel sounds are normal. There is no distension.     Palpations: Abdomen is soft. There is no mass.     Tenderness: There is no abdominal tenderness.  Musculoskeletal:        General: No tenderness. Normal range of motion.     Cervical back: Normal range of motion and neck supple.     Right lower leg: No edema.     Left lower leg: No edema.     Comments: Upper / Lower Extremities: - Normal muscle tone, strength bilateral upper extremities 5/5, lower extremities 5/5  Lymphadenopathy:     Cervical: No cervical adenopathy.  Skin:    General: Skin is warm and dry.     Findings: No erythema or rash.  Neurological:     Mental Status: He is alert and oriented to person, place, and time.     Comments: Distal sensation intact to light touch all extremities  Psychiatric:        Mood and Affect: Mood normal.        Behavior: Behavior normal.        Thought Content: Thought content normal.     Comments: Well groomed, good eye contact, normal speech and thoughts     Results for orders placed or performed in visit on 12/14/23  Iron,  TIBC and Ferritin Panel   Collection Time: 12/14/23 12:00 AM  Result Value Ref Range   Iron 60 50 - 180 mcg/dL   TIBC 679 749 - 574 mcg/dL (calc)   %SAT 19 (L) 20 - 48 % (calc)   Ferritin 14 (L) 24 - 380 ng/mL  Urinalysis, Routine w reflex microscopic   Collection Time: 12/14/23 12:00 AM  Result Value Ref Range   Color, Urine YELLOW YELLOW   APPearance CLEAR CLEAR   Specific Gravity, Urine 1.022 1.001 - 1.035   pH 5.5 5.0 - 8.0   Glucose, UA NEGATIVE NEGATIVE   Bilirubin Urine NEGATIVE NEGATIVE   Ketones, ur NEGATIVE NEGATIVE   Hgb urine dipstick NEGATIVE NEGATIVE   Protein, ur NEGATIVE NEGATIVE   Nitrite NEGATIVE NEGATIVE   Leukocytes,Ua NEGATIVE NEGATIVE  Comprehensive metabolic panel with GFR   Collection Time: 12/14/23 12:00 AM  Result Value Ref Range   Glucose, Bld 95 65 - 99 mg/dL   BUN 17 7 - 25 mg/dL   Creat 8.98 9.29 - 8.64 mg/dL   eGFR 84 > OR = 60 fO/fpw/8.26f7   BUN/Creatinine Ratio SEE NOTE: 6 - 22 (calc)   Sodium 141 135 - 146 mmol/L   Potassium 4.3 3.5 - 5.3 mmol/L   Chloride 106 98 - 110 mmol/L   CO2 27 20 - 32 mmol/L   Calcium 9.1 8.6 - 10.3 mg/dL   Total Protein 6.9 6.1 - 8.1 g/dL   Albumin 4.4 3.6 - 5.1 g/dL   Globulin 2.5 1.9 - 3.7 g/dL (calc)   AG Ratio 1.8 1.0 - 2.5 (calc)  Total Bilirubin 0.8 0.2 - 1.2 mg/dL   Alkaline phosphatase (APISO) 53 35 - 144 U/L   AST 19 10 - 35 U/L   ALT 14 9 - 46 U/L  PSA   Collection Time: 12/14/23 12:00 AM  Result Value Ref Range   PSA 0.38 < OR = 4.00 ng/mL  CBC with Differential/Platelet   Collection Time: 12/14/23 12:00 AM  Result Value Ref Range   WBC 3.4 (L) 3.8 - 10.8 Thousand/uL   RBC 4.24 4.20 - 5.80 Million/uL   Hemoglobin 12.6 (L) 13.2 - 17.1 g/dL   HCT 60.9 61.4 - 49.9 %   MCV 92.0 80.0 - 100.0 fL   MCH 29.7 27.0 - 33.0 pg   MCHC 32.3 32.0 - 36.0 g/dL   RDW 86.7 88.9 - 84.9 %   Platelets 259 140 - 400 Thousand/uL   MPV 10.0 7.5 - 12.5 fL   Neutro Abs 1,435 (L) 1,500 - 7,800 cells/uL   Absolute  Lymphocytes 1,064 850 - 3,900 cells/uL   Absolute Monocytes 486 200 - 950 cells/uL   Eosinophils Absolute 354 15 - 500 cells/uL   Basophils Absolute 61 0 - 200 cells/uL   Neutrophils Relative % 42.2 %   Total Lymphocyte 31.3 %   Monocytes Relative 14.3 %   Eosinophils Relative 10.4 %   Basophils Relative 1.8 %  Hemoglobin A1c   Collection Time: 12/14/23 12:00 AM  Result Value Ref Range   Hgb A1c MFr Bld 5.6 <5.7 %   Mean Plasma Glucose 114 mg/dL   eAG (mmol/L) 6.3 mmol/L  Lipid panel   Collection Time: 12/14/23 12:00 AM  Result Value Ref Range   Cholesterol 163 <200 mg/dL   HDL 75 > OR = 40 mg/dL   Triglycerides 45 <849 mg/dL   LDL Cholesterol (Calc) 75 mg/dL (calc)   Total CHOL/HDL Ratio 2.2 <5.0 (calc)   Non-HDL Cholesterol (Calc) 88 <869 mg/dL (calc)      Assessment & Plan:   Problem List Items Addressed This Visit   None Visit Diagnoses       Annual physical exam    -  Primary     Screening for colon cancer       Relevant Orders   Cologuard        Updated Health Maintenance information Reviewed recent lab results with patient Encouraged improvement to lifestyle with diet and exercise Goal of weight loss  Adult Wellness Visit Annual exam showed well-controlled cholesterol, improved A1c, normal labs, decreased PSA, and borderline anemia. - Continue current lifestyle and dietary habits. - Consider iron-rich foods to improve iron reserves.  Colorectal cancer screening Due for screening, eligible for Cologuard due to no previous polyps. - Order Cologuard test for home screening. - Provide instructions for Cologuard use.  BPH Lower urinary tract symptoms Symptoms improved with herbal supplement, inconsistent dosing, no recurrence since last episode. - Continue herbal supplement as needed. - Consider pausing supplement to assess symptom recurrence.  Arthritis of the foot Intermittent pain improved with activity, meloxicam  available but not regularly used. -  Continue regular exercise to manage symptoms. - Use meloxicam  as needed for pain management.  Borderline anemia and mild iron deficiency Borderline anemia with slightly low iron reserves, no need for supplementation unless symptoms worsen. - Consider iron-rich foods such as dark leafy greens and meats. - Check multivitamin for iron content.  Dry skin of the lower extremities Chronic dry skin likely due to circulation changes, temporary relief with foot creams. - Continue using  foot creams to manage dryness. - Consider daily moisturizers to improve skin hydration.  Cardiac Health Screening Discussed optional heart scan for early coronary artery disease detection. - Consider heart scan for coronary artery disease screening.         Orders Placed This Encounter  Procedures   Cologuard    No orders of the defined types were placed in this encounter.    Follow up plan: Return for 1 year fasting lab > 1 week later Annual Physical.  12/26/24  Marsa Officer, DO [facility] 12/21/2023, 8:42 AM

## 2023-12-21 NOTE — Patient Instructions (Addendum)
 Thank you for coming to the office today.  Recent Labs    12/14/23 0000  HGBA1C 5.6   Mild iron deficiency, eat more iron rich foods.  Check on cost of Shingles vaccine at pharmacy  Flu Shot later this season.  Colon Cancer Screening:  Ordered the Cologuard (home kit) test for colon cancer screening. Stay tuned for further updates.  It will be shipped to you directly. If not received in 2-4 weeks, call us  or the company.   If you send it back and no results are received in 2-4 weeks, call us  or the company as well!   Colon Cancer Screening: - For all adults age 72+ routine colon cancer screening is highly recommended.     - Recent guidelines from American Cancer Society recommend starting age of 78 - Early detection of colon cancer is important, because often there are no warning signs or symptoms, also if found early usually it can be cured. Late stage is hard to treat.   - If Cologuard is NEGATIVE, then it is good for 3 years before next due - If Cologuard is POSITIVE, then it is strongly advised to get a Colonoscopy, which allows the GI doctor to locate the source of the cancer or polyp (even very early stage) and treat it by removing it. ------------------------- Follow instructions to collect sample, you may call the company for any help or questions, 24/7 telephone support at [phone].  -----------------------  Consider future option  Coronary Calcium Score Cardiac CT Scan. This is a screening test for patients aged 17-50+ with cardiovascular risk factors or who are healthy but would be interested in Cardiovascular Screening for heart disease. Even if there is a family history of heart disease, this imaging can be useful. Typically it can be done every 5+ years or at a different timeline we agree on  The scan will look at the chest and mainly focus on the heart and identify early signs of calcium build up or blockages within the heart arteries. It is not 100%  accurate for identifying blockages or heart disease, but it is useful to help us  predict who may have some early changes or be at risk in the future for a heart attack or cardiovascular problem.  The results are reviewed by a Cardiologist and they will document the results. It should become available on MyChart. Typically the results are divided into percentiles based on other patients of the same demographic and age. So it will compare your risk to others similar to you. If you have a higher score >99 or higher percentile >75%tile, it is recommended to consider Statin cholesterol therapy and or referral to Cardiologist. I will try to help explain your results and if we have questions we can contact the Cardiologist.  You will be contacted for scheduling. Usually it is done at any imaging facility through [facility], [facility] or [facility].  The cost is $99 flat fee total and it does not go through insurance, so no authorization is required.    Please schedule a Follow-up Appointment to: Return for 1 year fasting lab > 1 week later Annual Physical.  If you have any other questions or concerns, please feel free to call the office or send a message through MyChart. You may also schedule an earlier appointment if necessary.  Additionally, you may be receiving a survey about your experience at our office within a few days to 1 week by e-mail or mail. We value  your feedback.  Marsa Officer, DO [facility], [place]

## 2024-01-26 ENCOUNTER — Telehealth: Payer: Self-pay

## 2024-01-26 NOTE — Telephone Encounter (Signed)
 Just an FYI. Attempted to reach patient. Left VM to call back. Doesn't look like he uses MyChart. Okay to advise below if patient calls back.

## 2024-01-26 NOTE — Telephone Encounter (Signed)
 Copied from CRM #[id number]. Topic: Clinical - Lab/Test Results >> Jan 26, 2024  9:57 AM DeAngela L wrote: Reason for CRM: Sharlet with [employer] calling about a Cologuard test returned to their office by the patient but it's missing the time and date information from the patient and at this time she states they will not be able to process it and they have tried to reach the patient but no success, and are asking if the office could contact the patient and have him call their office back  [employer] num [phone]  patient will select = Patient support prompt  Hours 6am - 10 pm CST

## 2024-02-01 ENCOUNTER — Ambulatory Visit (INDEPENDENT_AMBULATORY_CARE_PROVIDER_SITE_OTHER)

## 2024-02-01 DIAGNOSIS — Z23 Encounter for immunization: Secondary | ICD-10-CM | POA: Diagnosis not present

## 2024-02-01 NOTE — Addendum Note (Signed)
 Addended by: BLINDA DOUGLAS on: 02/01/2024 04:23 PM   Modules accepted: Orders

## 2024-02-01 NOTE — Progress Notes (Deleted)
 SABRA

## 2024-02-02 ENCOUNTER — Ambulatory Visit: Payer: Self-pay | Admitting: Family Medicine

## 2024-02-02 LAB — COLOGUARD: COLOGUARD: NEGATIVE

## 2024-04-11 ENCOUNTER — Ambulatory Visit: Admitting: Family Medicine

## 2024-04-11 ENCOUNTER — Encounter: Payer: Self-pay | Admitting: Family Medicine

## 2024-04-11 VITALS — BP 130/84 | HR 60 | Ht 70.0 in | Wt 189.0 lb

## 2024-04-11 DIAGNOSIS — G4700 Insomnia, unspecified: Secondary | ICD-10-CM | POA: Diagnosis not present

## 2024-04-11 NOTE — Patient Instructions (Addendum)
 Thank you for coming to the office today.  6 to 9 hours is normal Maybe need to move the sleep time back a little bit and try to maintain sleep Goal to keep active  Melatonin 5mg  nightly recommended for sleep maintenance.  Sleep Hygiene Recommendations to promote healthy sleep in all patients, especially if symptoms of insomnia are worsening. Due to the nature of sleep rhythms, if your body gets out of rhythm, it may take some time before your sleep cycle can be reset.  Please try to follow as many of the following tips as you can, usually there are only a few of these are the primary cause of the problem.  ?To reset your sleep rhythm, go to bed and get up at the same time every day ?Sleep only long enough to feel rested and then get out of bed ?Do not try to force yourself to sleep. If you can't sleep, get out of bed and try again later. ?Avoid naps during the day, unless excessively tired. The more sleeping during the day, then the less sleep your body needs at night.  ?Have coffee, tea, and other foods that have caffeine only in the morning ?Exercise several days a week, but not right before bed ?If you drink alcohol, prefer to have appropriate drink with one meal, but prefer to avoid alcohol in the evening, and bedtime ?If you smoke, avoid smoking, especially in the evening  ?Avoid watching TV or looking at phones, computers, or reading devices (e-books) that give off light at least 30 minutes before bed. This artificial light sends awake signals to your brain and can make it harder to fall asleep. ?Make your bedroom a comfortable place where it is easy to fall asleep: Put up shades or special blackout curtains to block light from outside. Use a white noise machine to block noise. Keep the temperature cool. ?Try your best to solve or at least address your problems before you go to bed ?Use relaxation techniques to manage stress. Ask your health care provider to suggest some  techniques that may work well for you. These may include: Breathing exercises. Routines to release muscle tension. Visualizing peaceful scenes.     Please schedule a Follow-up Appointment to: Return if symptoms worsen or fail to improve.  If you have any other questions or concerns, please feel free to call the office or send a message through MyChart. You may also schedule an earlier appointment if necessary.  Additionally, you may be receiving a survey about your experience at our office within a few days to 1 week by e-mail or mail. We value your feedback.  Marsa Officer, DO [facility] [place], [place]

## 2024-04-11 NOTE — Progress Notes (Signed)
 Subjective:    Patient ID: Nicholas Weber, male    DOB: Oct 23, 1960, 63 y.o.   MRN: [id number]  Nicholas Weber is a 63 y.o. male presenting on 04/11/2024 for Insomnia   HPI  Discussed the use of AI scribe software for clinical note transcription with the patient, who gave verbal consent to proceed.  History of Present Illness   Nicholas Weber is a 63 year old male who presents with trouble sleeping.  Sleep disturbance - Chronic difficulty sleeping for an extended period - Typical sleep duration is 4-5 hours per night - Bedtime between 11 PM and midnight; wakes at 5:30 AM for work - Frequent nighttime awakenings: first around 3 AM, then again around 4 AM, with final awakening at 5:15 AM - Difficulty returning to sleep after awakening, especially when waking closer to usual wake time - No awakening due to pain, breathing issues, or mental health concerns such as anxiety or panic - Nocturia not a primary cause of awakening; urge to urinate typically coincides with end of sleep cycle  Sleep hygiene and interventions - Melatonin use provides more sound sleep but does not increase total sleep duration - Exercises approximately twice per week, which may influence sleep quality          12/21/2023    9:14 AM 08/21/2023    2:03 PM 06/01/2023   11:30 AM  Depression screen PHQ 2/9  Decreased Interest 0 0 0  Down, Depressed, Hopeless 0 0 0  PHQ - 2 Score 0 0 0  Altered sleeping 0 0   Tired, decreased energy 0 0   Change in appetite 0 0   Feeling bad or failure about yourself  0 0   Trouble concentrating 0 0   Moving slowly or fidgety/restless 0 0   Suicidal thoughts 0 0   PHQ-9 Score 0  0    Difficult doing work/chores  Not difficult at all      Data saved with a previous flowsheet row definition       12/21/2023    9:14 AM 08/21/2023    2:04 PM 06/01/2023   11:30 AM 04/13/2023    3:50 PM  GAD 7 : Generalized Anxiety Score  Nervous, Anxious, on Edge 0 0 0 0   Control/stop worrying 0 0 0 0  Worry too much - different things 0 0 0 0  Trouble relaxing 0 0 0 0  Restless 0 0 0 0  Easily annoyed or irritable 0 0 0 0  Afraid - awful might happen 0 0 0 0  Total GAD 7 Score 0 0 0 0  Anxiety Difficulty  Not difficult at all      Social History[1]  Review of Systems Per HPI unless specifically indicated above     Objective:    BP 130/84 (BP Location: Left Arm, Patient Position: Sitting, Cuff Size: Normal)   Pulse 60   Ht 5' 10 (1.778 m)   Wt 189 lb (85.7 kg)   SpO2 100%   BMI 27.12 kg/m   Wt Readings from Last 3 Encounters:  04/11/24 189 lb (85.7 kg)  12/21/23 184 lb 12.8 oz (83.8 kg)  08/21/23 188 lb 6.4 oz (85.5 kg)    Physical Exam Vitals and nursing note reviewed.  Constitutional:      General: He is not in acute distress.    Appearance: Normal appearance. He is well-developed. He is not diaphoretic.     Comments: Well-appearing, comfortable, cooperative  HENT:  Head: Normocephalic and atraumatic.  Eyes:     General:        Right eye: No discharge.        Left eye: No discharge.     Conjunctiva/sclera: Conjunctivae normal.  Cardiovascular:     Rate and Rhythm: Normal rate.  Pulmonary:     Effort: Pulmonary effort is normal.  Skin:    General: Skin is warm and dry.     Findings: No erythema or rash.  Neurological:     Mental Status: He is alert and oriented to person, place, and time.  Psychiatric:        Mood and Affect: Mood normal.        Behavior: Behavior normal.        Thought Content: Thought content normal.     Comments: Well groomed, good eye contact, normal speech and thoughts     Results for orders placed or performed in visit on 12/21/23  Cologuard   Collection Time: 01/18/24 10:30 AM  Result Value Ref Range   COLOGUARD Negative Negative      Assessment & Plan:   Problem List Items Addressed This Visit   None Visit Diagnoses       Insomnia, unspecified type    -  Primary         Insomnia Chronic insomnia with difficulty maintaining sleep. No significant nocturia or medical cause identified. Melatonin beneficial. Pharmacological intervention not appropriate. - Recommended melatonin 5 mg nightly. - Provided sleep hygiene education: avoid late meals, increase physical activity. - Advised against prescription sleep medications due to side effects and dependency risks.        No orders of the defined types were placed in this encounter.   No orders of the defined types were placed in this encounter.   Follow up plan: Return if symptoms worsen or fail to improve.   Marsa Officer, DO [facility] 04/11/2024, 11:54 AM     [1]  Social History Tobacco Use   Smoking status: Never   Smokeless tobacco: Never  Vaping Use   Vaping status: Never Used  Substance Use Topics   Alcohol use: Yes   Drug use: No

## 2024-05-16 ENCOUNTER — Ambulatory Visit: Admitting: Family Medicine

## 2024-05-16 ENCOUNTER — Encounter: Payer: Self-pay | Admitting: Family Medicine

## 2024-05-16 VITALS — BP 128/80 | HR 63 | Ht 70.0 in | Wt 191.4 lb

## 2024-05-16 DIAGNOSIS — N401 Enlarged prostate with lower urinary tract symptoms: Secondary | ICD-10-CM | POA: Diagnosis not present

## 2024-05-16 DIAGNOSIS — R350 Frequency of micturition: Secondary | ICD-10-CM

## 2024-05-16 LAB — POCT URINE DIPSTICK
Bilirubin, UA: NEGATIVE
Blood, UA: NEGATIVE
Glucose, UA: NEGATIVE mg/dL
Ketones, POC UA: NEGATIVE mg/dL
Leukocytes, UA: NEGATIVE
Nitrite, UA: NEGATIVE
POC PROTEIN,UA: NEGATIVE
Spec Grav, UA: 1.015
Urobilinogen, UA: 0.2 U/dL
pH, UA: 5

## 2024-05-16 MED ORDER — TAMSULOSIN HCL 0.4 MG PO CAPS: 0.4000 mg | ORAL_CAPSULE | Freq: Every day | ORAL | 3 refills | Status: AC

## 2024-05-16 NOTE — Patient Instructions (Addendum)
 Thank you for coming to the office today.  Stop Saw Palmetto  Start Tamsulosin  (generic Flomax ) 0.4mg  capsule daily every day with meal breakfast.  Caution with sudden standing can get mild light headedness.  We can follow-up on this or re-evaluate at next time.  May consider a Urologist if still have question.  Please schedule a Follow-up Appointment to: Return if symptoms worsen or fail to improve.  If you have any other questions or concerns, please feel free to call the office or send a message through MyChart. You may also schedule an earlier appointment if necessary.  Additionally, you may be receiving a survey about your experience at our office within a few days to 1 week by e-mail or mail. We value your feedback.  Marsa Officer, DO [facility], [place]

## 2024-05-16 NOTE — Progress Notes (Signed)
 "  Subjective:    Patient ID: Nicholas Weber, male    DOB: [birth year], 64 y.o.   MRN: [id number]  Nicholas Weber is a 64 y.o. male presenting on 05/16/2024 for Urinary Frequency (About 1 week )  Patient presents for a same day appointment.   HPI  Discussed the use of AI scribe software for clinical note transcription with the patient, who gave verbal consent to proceed.  History of Present Illness   [name] is a 64 year old male who presents with urinary frequency and urgency.  Suspected BPH Lower urinary tract symptoms - Urinary frequency and urgency, similar to symptoms experienced in April of the previous year - Requires urination every ten to fifteen minutes with a sensation of urgency - Urinary flow often weak and sometimes minimal, especially after recent urination - No significant burning sensation during urination - Symptoms influenced by fluid intake - Frequent urges to urinate and slow urinary flow persist despite partial improvement  Impact on daily life - Frequent trips to the bathroom causing concern about daily functioning  Response to treatment - Initially managed symptoms with saw palmetto , which was effective over time - Symptoms recurred last week, prompting resumption of saw palmetto  at 450 mg twice daily - Slight improvement noted with current regimen, but symptoms continue          12/21/2023    9:14 AM 08/21/2023    2:03 PM 06/01/2023   11:30 AM  Depression screen PHQ 2/9  Decreased Interest 0 0 0  Down, Depressed, Hopeless 0 0 0  PHQ - 2 Score 0 0 0  Altered sleeping 0 0   Tired, decreased energy 0 0   Change in appetite 0 0   Feeling bad or failure about yourself  0 0   Trouble concentrating 0 0   Moving slowly or fidgety/restless 0 0   Suicidal thoughts 0 0   PHQ-9 Score 0  0    Difficult doing work/chores  Not difficult at all      Data saved with a previous flowsheet row definition       12/21/2023    9:14 AM 08/21/2023     2:04 PM 06/01/2023   11:30 AM 04/13/2023    3:50 PM  GAD 7 : Generalized Anxiety Score  Nervous, Anxious, on Edge 0 0 0 0  Control/stop worrying 0 0 0 0  Worry too much - different things 0 0 0 0  Trouble relaxing 0 0 0 0  Restless 0 0 0 0  Easily annoyed or irritable 0 0 0 0  Afraid - awful might happen 0 0 0 0  Total GAD 7 Score 0 0 0 0  Anxiety Difficulty  Not difficult at all      Social History[1]  Review of Systems Per HPI unless specifically indicated above     Objective:    BP 128/80 (BP Location: Right Arm, Patient Position: Sitting, Cuff Size: Normal)   Pulse 63   Ht 5' 10 (1.778 m)   Wt 191 lb 6 oz (86.8 kg)   SpO2 99%   BMI 27.46 kg/m   Wt Readings from Last 3 Encounters:  05/16/24 191 lb 6 oz (86.8 kg)  04/11/24 189 lb (85.7 kg)  12/21/23 184 lb 12.8 oz (83.8 kg)    Physical Exam Vitals and nursing note reviewed.  Constitutional:      General: He is not in acute distress.    Appearance: Normal appearance. He  is well-developed. He is not diaphoretic.     Comments: Well-appearing, comfortable, cooperative  HENT:     Head: Normocephalic and atraumatic.  Eyes:     General:        Right eye: No discharge.        Left eye: No discharge.     Conjunctiva/sclera: Conjunctivae normal.  Cardiovascular:     Rate and Rhythm: Normal rate.  Pulmonary:     Effort: Pulmonary effort is normal.  Skin:    General: Skin is warm and dry.     Findings: No erythema or rash.  Neurological:     Mental Status: He is alert and oriented to person, place, and time.  Psychiatric:        Mood and Affect: Mood normal.        Behavior: Behavior normal.        Thought Content: Thought content normal.     Comments: Well groomed, good eye contact, normal speech and thoughts     Results for orders placed or performed in visit on 05/16/24  POCT URINE DIPSTICK   Collection Time: 05/16/24  7:56 AM  Result Value Ref Range   Color, UA yellow yellow   Clarity, UA clear clear    Glucose, UA negative negative mg/dL   Bilirubin, UA negative negative   Ketones, POC UA negative negative mg/dL   Spec Grav, UA 8.984 8.989 - 1.025   Blood, UA negative negative   pH, UA 5.0 5.0 - 8.0   POC PROTEIN,UA negative negative, trace   Urobilinogen, UA 0.2 0.2 or 1.0 E.U./dL   Nitrite, UA Negative Negative   Leukocytes, UA Negative Negative      Assessment & Plan:   Problem List Items Addressed This Visit     BPH with lower urinary tract symptoms without urinary obstruction - Primary   Relevant Medications   tamsulosin  (FLOMAX ) 0.4 MG CAPS capsule   Other Relevant Orders   POCT URINE DIPSTICK (Completed)   Other Visit Diagnoses       Urinary frequency       Relevant Orders   POCT URINE DIPSTICK (Completed)        Suspected Benign prostatic hyperplasia with lower urinary tract symptoms Recurrent urinary frequency and urgency with slow flow due to benign prostatic hyperplasia. Urine test negative for infection. Improved on Saw Palmetto  in past. Discussed Flomax  (tamsulosin ) to improve urinary flow and reduce frequency. Expectations with alpha blocker therapy, chronic condition Discussed potential side effects and alternative treatments.  UA dipstick negative. Cancelled Urine Culture.  - Discontinued Saw Palmetto . - Started Flomax  (tamsulosin ) 0.4 mg daily with breakfast. - Cautioned with sudden standing due to potential dizziness and lightheadedness. - Ordered 90-day supply of Flomax . - Will follow up on PSA prostate level blood test later this year. - Consider referral to urologist if symptoms persist or worsen.        Orders Placed This Encounter  Procedures   POCT URINE DIPSTICK    Meds ordered this encounter  Medications   tamsulosin  (FLOMAX ) 0.4 MG CAPS capsule    Sig: Take 1 capsule (0.4 mg total) by mouth daily after breakfast.    Dispense:  90 capsule    Refill:  3    Follow up plan: Return if symptoms worsen or fail to  improve.  Marsa Officer, DO [facility] 05/16/2024, 11:51 AM     [1]  Social History Tobacco Use   Smoking status: Never  Smokeless tobacco: Never  Vaping Use   Vaping status: Never Used  Substance Use Topics   Alcohol use: Yes   Drug use: No   "

## 2024-06-20 ENCOUNTER — Ambulatory Visit: Admitting: Family Medicine

## 2024-12-26 ENCOUNTER — Other Ambulatory Visit

## 2025-01-09 ENCOUNTER — Encounter: Admitting: Family Medicine
